# Patient Record
Sex: Female | Born: 1995 | Race: White | Hispanic: No | Marital: Married
Health system: Midwestern US, Academic
[De-identification: ages and names within clinical notes are randomized; demographics above are authoritative.]

---

## 2018-08-19 ENCOUNTER — Encounter: Admit: 2018-08-19 | Discharge: 2018-08-19

## 2018-08-19 DIAGNOSIS — Z3491 Encounter for supervision of normal pregnancy, unspecified, first trimester: Secondary | ICD-10-CM

## 2018-08-19 DIAGNOSIS — O09299 Supervision of pregnancy with other poor reproductive or obstetric history, unspecified trimester: Secondary | ICD-10-CM

## 2018-08-25 ENCOUNTER — Encounter: Admit: 2018-08-25 | Discharge: 2018-08-25

## 2018-08-25 NOTE — Telephone Encounter
Called patient in regards to missed appointment on 08/23/18. Calling to get her rescheduled. Instructed her to call [phone] to get appt scheduled at soonest convenience.

## 2019-03-16 ENCOUNTER — Encounter: Admit: 2019-03-16 | Discharge: 2019-03-16 | Payer: MEDICAID

## 2019-03-20 ENCOUNTER — Encounter: Admit: 2019-03-20 | Discharge: 2019-03-20 | Payer: MEDICAID

## 2019-03-20 NOTE — Telephone Encounter
Patient returned my call. Confirmed that she wants to consult for a VBAC and provided appointment date/time and location. Provided address and directions to the clinic. Advised to arrive at least 15 minutes early for check in. Patient verbalized understanding. Informed that we will need the Op report from her previous c/s prior to visit. Confirmed that patient delivered at [facility] and informed that RN faxed a request for her records. Advised patient to f/u with Dr. Thomasene Lot office to see if they have a copy of this report as well. Advised to have Dr. Thomasene Lot office fax a copy and/or contact [facility] to request to have her c/s report faxed ASAP. Provided the fax number for Medical Records. Patient verbalized understanding. Provided the phone number for Team 1 nurses so that patient can f/u on this request. Patient will be 36wk4d at time of consult. Confirmed that [employer] insurance is active.

## 2019-03-20 NOTE — Telephone Encounter
Received an email from Team 1 nurses, stating that this patient has a referral for consult VBAC and will need to be scheduled. Patient is currently 36wks and is transferring care from Dr. Thomasene Lot office at [facility] in [place]. Team 1 provided a phone number of [phone] for [facility], Dr. Thomasene Lot nurse, as a means for contact.   Called [facility] and and left a detailed message for [place]. Advised that RN will need for her to call me back as soon as possible. Also called and left a message for patient. RN scheduled patient for this week to f/u with Dr. Pecola Leisure. Will provide appointment information/instructions at time of call back. Records have already been scanned into the chart. RN will send a request for c/s (Op) report at this time. Staff message sent to the PSRs to [employer].

## 2019-03-20 NOTE — Telephone Encounter
Received a message from Lyla Son, nurse at Dr. Alvis Lemmings office, stating that she is returning my call regarding a STAT referral. Requested a call back and provided a call back number of [phone].  Called and attempted to contact Carrie x 2, however the phone rings for several times and then goes to a busy signal. Will inform Lyla Son that this patient is already scheduled and has been contacted, at time of call back.

## 2019-03-22 ENCOUNTER — Encounter: Admit: 2019-03-22 | Discharge: 2019-03-22 | Payer: MEDICAID

## 2019-03-22 NOTE — Telephone Encounter
Records received from [facility] given to [place] to scan to chart ASAP

## 2019-03-23 ENCOUNTER — Ambulatory Visit: Admit: 2019-03-23 | Discharge: 2019-03-24 | Payer: MEDICAID

## 2019-03-23 ENCOUNTER — Encounter: Admit: 2019-03-23 | Discharge: 2019-03-23 | Payer: MEDICAID

## 2019-03-23 DIAGNOSIS — F4310 Post-traumatic stress disorder, unspecified: Secondary | ICD-10-CM

## 2019-03-23 DIAGNOSIS — J45909 Unspecified asthma, uncomplicated: Secondary | ICD-10-CM

## 2019-03-23 DIAGNOSIS — F419 Anxiety disorder, unspecified: Secondary | ICD-10-CM

## 2019-03-23 DIAGNOSIS — Z3400 Encounter for supervision of normal first pregnancy, unspecified trimester: Secondary | ICD-10-CM

## 2019-03-23 DIAGNOSIS — N390 Urinary tract infection, site not specified: Secondary | ICD-10-CM

## 2019-03-23 DIAGNOSIS — F329 Major depressive disorder, single episode, unspecified: Secondary | ICD-10-CM

## 2019-03-23 DIAGNOSIS — Z8742 Personal history of other diseases of the female genital tract: Secondary | ICD-10-CM

## 2019-03-23 MED ORDER — ALBUTEROL SULFATE 90 MCG/ACTUATION IN HFAA
2 | RESPIRATORY_TRACT | 0 refills | Status: AC | PRN
Start: 2019-03-23 — End: ?

## 2019-03-23 MED ORDER — CEPHALEXIN 250 MG PO CAP
250 mg | ORAL_CAPSULE | Freq: Every day | ORAL | 0 refills | Status: AC
Start: 2019-03-23 — End: ?

## 2019-03-23 NOTE — Progress Notes
S: 24 y.o. [id number] @ [redacted]w[redacted]d by 1st trimester outside ultrasound per patient report. (VAVDx1 H/o C/S x1) Estimated Date of Delivery: 04/17/2019. This is an unplanned but desired pregnancy. She feels excited FOB is involved.   +FM, +CTX occ VB/LOF. No concerns.     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 She has lost these inhalers when she moved and has not needed them.   Anxiety/depression/PTSD follows with counselor and case management worker previously on zoloft, stopped during pregnancy, PPD after last delivery    H/o recurret UTIs: multiple UTIs this pregnancy, not taking any suppressive medication at this time   Tobacco use: vapes occasionally, no long with tobacco use, pods last for about 3 days   H/o C/S x1, desires TOLAC, op report reviewed PLTCS performed for IUGR.   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, she has been on 1000mg  of Depakote but discontinued when she moved and stopped following with a neurologist at 2018, when asked if she has had a seizure since that time states probably she is unable to tell if she is having one so this is a difficult question to answer      OBHx:  OB History   Gravida Para Term Preterm AB Living   7 1 1   4 2    SAB TAB Ectopic Multiple Live Births   4              # Outcome Date GA Lbr Len/2nd Weight Sex Delivery Anes PTL Lv   7 Gravida            6 Gravida            5 SAB            4 SAB            3 SAB            2 SAB            1 Term            1st delivery: FT, VAVD with 3rd degree tear, 3400g  2nd delivery: 37 wga, PLTCS for IUGR, 2500g,     GynHx:  LMP: unknown  Pap: H/o abnormal pap HSIL HPV s/p colpo, ECC, CKC discussion of hysterectomy after this result, completed in [place] maybe [place], will request records.   - was placed on a form of chemotherapy a pill that was supposed to slow the growth of the cells - She had a uterine infection that would not go away after the delivery of both of her children and a hysterectomy was discussed    STI: h/o CT in 2019 s/p treatment     MedHx:  Medical History:   Diagnosis Date   ? Anxiety    ? Asthma    ? Depression    ? PTSD (post-traumatic stress disorder)    ? UTI (urinary tract infection)      Medications:  Objective:         ? cyclobenzaprine HCl (FLEXERIL PO) Take 10 mg by mouth. Once Daily   ? HYDROCODONE-ACETAMINOPHEN PO Take 325 mg by mouth. BID (PRN)   ? prenatal vits62/FA/om3/dha/epa (PRENATAL GUMMY PO) Take  by mouth.     Allergies    Allergies   Allergen Reactions   ? Ibuprofen ANAPHYLAXIS   ? Ketorolac Tromethamine  ANAPHYLAXIS   ? Nsaids (Non-Steroidal Anti-Inflammatory Drug) ANAPHYLAXIS and RASH   ? Amoxicillin HIVES   ? Nitrofurantoin Monohyd/M-Cryst RASH   ? Ranitidine HIVES     Shx:  Surgical History:   Procedure Laterality Date   ? CESAREAN SECTION       FamHx:  Family History   Problem Relation Age of Onset   ? Heart Disease Mother    ? Diabetes Mother    ? Hypertension Mother    ? Diabetes Maternal Grandmother    ? Pulmonary Embolism Maternal Grandfather      SocHx:  Lives with: partner feels safe at home  Denies: EtOH, tobacco, THC use but does vape     O:   Vitals:    03/23/19 1003   BP: 112/81   Pulse: 115   Temp: 36.8 ?C (98.2 ?F)     FH: 35cm  FHT: 145  FH 35cm  Gen: NAD  Abd: gravid nttp  Ext: no LE edema  SVD: cl/50/-3     A/P:  Emily [name] is a 24 y.o. 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, she has been on 1000mg  of Depakote but discontinued when she moved and stopped following with a neurologist at 2018, when asked if she has had a seizure since that time states probably she is unable to tell if she is having one so this is a difficult question to answer    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  Pt understands that there is a risk of uterine rupture, 0.5-1%.  Pt also understands there is a risk of resulting fetal neurologic injury of approximately 10% if that were to occur, or even death.  Also discussed risk of failure and need for repeat c/s.  Pt voiced understanding and elected to proceed with TOLAC.  - requested outside sono records as we cannot schedule patient's desired IOL at 53 wga until we have accurate dating     Routine PNL  BCM Plan: undecided, maybe IUD   Undecided on feeding, leaning towards bottle   Continue Routine Prenatal Care  NT wnl  DUS: per outside records, 38%, posterior placenta, possible small VSD on DUS however fetal echo wnl  S/p TDaP vaccination   GTT normal per patient  GBS collected at [facility]   Labor Precautions and [facility]KC instructions given  Flu Vaccine declines     Next appointment, determine once IOL scheduled     D/w Dr. Aubery Lapping, MD  PGY-2 Obstetrics and Gynecology

## 2019-03-23 NOTE — Patient Instructions
General Instructions  For prompt and efficient communication, MyChart is preferred. Please sign up.  [email or web address]      To Contact Me:   Please send a MyChart message to your doctor or call [phone] to reach your doctor's nurse team. Please only call once in a 24-hour period. Leave a voicemail for the nurse to respond.  Calls or messages received after 4pm will be returned the next business day.  To Have Medication Refilled:   Please use the MyChart Refill request or contact your pharmacy directly to request medication refills. Please allow 72 hours.  To Receive Your Test Results:  ? Please allow 2 business day for labs to result in MyChart.  ? Please allow 4 business days for other tests, including cardiac studies, blood bank, and radiology results.  ? It can take up to 10 days for pathology  Results NOT released to MyChart:  ? OB Ultrasound  ? Genetic testing  ? Labs that are sent to outside facilities  Our Lab (Location, Hours, and Fasting Information)  Lab Location: the 1st floor of the Medical Office Building, directly to the left of the Information Desk.  Lab Hours: Monday 6:30 am-6 pm, Tuesday-Friday 7 am-6 pm, and Saturday 7 am-noon.   Fasting for Lab: For fasting labs, please:   Do not eat for at least 8-10 hours before having your labs drawn, drink plenty of water, and make sure to continue your medications as prescribed (unless otherwise specified).  Radiology is on the 2nd floor of the Medical Office Building. Please call [phone], option #1; Mammogram at [facility] location, please call [phone], option #1.  To Schedule an Appointment:   Contact our schedulers at [phone] and select #1 to make an appointment. At this time, you will be encouraged to signed up to MyChart if you not active.     To Receive Appointment Reminders on Your Cell Phone:   Make sure we have your cell phone number, and text [place] to [phone].  For Urgent Questions: For medical emergencies, call 911.  On nights, weekends or holidays, call the Hospital operator at [phone], and ask for the ?Ob/Gyn Physician on call or Certified Nurse Midwife on call.?        We value your feedback and you may receive a survey about your experience with our office. If you had a favorable experience, the only positive survey results that we will receive are if we are rated in the 9 or 10 range out of 10 points. Please let us know why we did not earn 9-10 rating.  Thank you.

## 2019-03-24 ENCOUNTER — Encounter: Admit: 2019-03-24 | Discharge: 2019-03-24 | Payer: MEDICAID

## 2019-03-26 ENCOUNTER — Encounter: Admit: 2019-03-26 | Discharge: 2019-03-26 | Payer: MEDICAID

## 2019-03-26 NOTE — Progress Notes
Patient was seen for consult for TOLAC. Desires IOL.    Unfortunately we do not have her ultrasound report from [facility]. This has been requested by nursing staff. Once we receive that ultrasound report we can confirm dating and schedule IOL at 37 wga per patient request.    Clemetine Marker, MD  PGY 2 Obstetrics and Gynecology  Pager: [phone]

## 2019-03-28 ENCOUNTER — Encounter: Admit: 2019-03-28 | Discharge: 2019-03-28 | Payer: MEDICAID

## 2019-03-28 NOTE — Telephone Encounter
Rocky Crafts, LPN  Chrisandra Netters, MD; Miguel Aschoff, LPN; Rocky Crafts, LPN            I spoke with a nurse and she faxed over the ultrasounds Angie will scan them in stat    Previous Messages    ----- Message -----   From: Chrisandra Netters, MD   Sent: 03/26/2019 10:54 AM CST   To: Miguel Aschoff, LPN, Rocky Crafts, LPN     Hello!     Just following up on this patient. Has her ultrasound report from [facility] been requested?   Once we get that we can schedule her IOL.     Thank you!     [name]

## 2019-03-30 ENCOUNTER — Encounter: Admit: 2019-03-30 | Discharge: 2019-03-30 | Payer: MEDICAID

## 2019-03-30 NOTE — Telephone Encounter
Emily Crafts, LPN  Chrisandra Netters, MD; Emily Aschoff, LPN; Emily Crafts, LPN            Spoke with patient notified her induction date directions given    Previous Messages    ----- Message -----   From: Chrisandra Netters, MD   Sent: 03/30/2019  8:50 AM CST   To: Emily Aschoff, LPN, Emily Crafts, LPN     Please call and inform patient of scheduled IOL at 2000 on 04/09/20. Thank you!   ----- Message -----   From: Emily Crafts, LPN   Sent: 0/08/1217  9:29 AM CST   To: Emily Aschoff, LPN, Chrisandra Netters, MD, *     I spoke with a nurse and she faxed over the ultrasounds Angie will scan them in stat   ----- Message -----   From: Chrisandra Netters, MD   Sent: 03/26/2019 10:54 AM CST   To: Emily Aschoff, LPN, Emily Crafts, LPN     Hello!     Just following up on this patient. Has her ultrasound report from [facility] been requested?   Once we get that we can schedule her IOL.     Thank you!     [name]

## 2019-03-30 NOTE — Progress Notes
OSH dating ultrasound received.     Patient with 8wk sono confirming due date of 04/17/19, placing her at [redacted]w[redacted]d today.     IOL for TOLAC scheduled for 39 wga per patient request, 04/10/19 at 2000.     Clemetine Marker, MD  PGY 2 Obstetrics and Gynecology  Pager: [phone]

## 2019-04-02 ENCOUNTER — Encounter: Admit: 2019-04-02 | Discharge: 2019-04-02 | Payer: MEDICAID

## 2019-04-02 DIAGNOSIS — N390 Urinary tract infection, site not specified: Secondary | ICD-10-CM

## 2019-04-02 DIAGNOSIS — F4310 Post-traumatic stress disorder, unspecified: Secondary | ICD-10-CM

## 2019-04-02 DIAGNOSIS — F419 Anxiety disorder, unspecified: Secondary | ICD-10-CM

## 2019-04-02 DIAGNOSIS — F329 Major depressive disorder, single episode, unspecified: Secondary | ICD-10-CM

## 2019-04-02 DIAGNOSIS — J45909 Unspecified asthma, uncomplicated: Secondary | ICD-10-CM

## 2019-04-03 ENCOUNTER — Encounter: Admit: 2019-04-03 | Discharge: 2019-04-03 | Payer: MEDICAID

## 2019-04-03 ENCOUNTER — Encounter: Admit: 2019-04-03 | Discharge: 2019-04-02 | Payer: MEDICAID

## 2019-04-03 DIAGNOSIS — N390 Urinary tract infection, site not specified: Secondary | ICD-10-CM

## 2019-04-03 DIAGNOSIS — F4310 Post-traumatic stress disorder, unspecified: Secondary | ICD-10-CM

## 2019-04-03 DIAGNOSIS — F329 Major depressive disorder, single episode, unspecified: Secondary | ICD-10-CM

## 2019-04-03 DIAGNOSIS — J45909 Unspecified asthma, uncomplicated: Secondary | ICD-10-CM

## 2019-04-03 DIAGNOSIS — F419 Anxiety disorder, unspecified: Secondary | ICD-10-CM

## 2019-04-03 DIAGNOSIS — R569 Unspecified convulsions: Secondary | ICD-10-CM

## 2019-04-03 MED ORDER — OXYCODONE 5 MG PO TAB
5 mg | Freq: Once | ORAL | 0 refills | Status: CP
Start: 2019-04-03 — End: ?
  Administered 2019-04-03: 09:00:00 5 mg via ORAL

## 2019-04-03 MED ORDER — ACETAMINOPHEN 500 MG PO TAB
1000 mg | ORAL | 0 refills | Status: DC | PRN
Start: 2019-04-03 — End: 2019-04-03
  Administered 2019-04-03: 07:00:00 1000 mg via ORAL

## 2019-04-08 ENCOUNTER — Encounter: Admit: 2019-04-08 | Discharge: 2019-04-08 | Payer: MEDICAID

## 2019-04-08 DIAGNOSIS — F4310 Post-traumatic stress disorder, unspecified: Secondary | ICD-10-CM

## 2019-04-08 DIAGNOSIS — R569 Unspecified convulsions: Secondary | ICD-10-CM

## 2019-04-08 DIAGNOSIS — F419 Anxiety disorder, unspecified: Secondary | ICD-10-CM

## 2019-04-08 DIAGNOSIS — N390 Urinary tract infection, site not specified: Secondary | ICD-10-CM

## 2019-04-08 DIAGNOSIS — J45909 Unspecified asthma, uncomplicated: Secondary | ICD-10-CM

## 2019-04-08 DIAGNOSIS — F329 Major depressive disorder, single episode, unspecified: Secondary | ICD-10-CM

## 2019-04-08 MED ORDER — ACETAMINOPHEN 500 MG PO TAB
1000 mg | Freq: Once | ORAL | 0 refills | Status: CP
Start: 2019-04-08 — End: ?
  Administered 2019-04-09: 02:00:00 1000 mg via ORAL

## 2019-04-08 MED ORDER — LACTATED RINGERS IV SOLP
1000 mL | INTRAVENOUS | 0 refills | Status: CP
Start: 2019-04-08 — End: ?

## 2019-04-10 ENCOUNTER — Inpatient Hospital Stay: Admit: 2019-04-10 | Discharge: 2019-04-10 | Payer: MEDICAID

## 2019-04-10 ENCOUNTER — Encounter: Admit: 2019-04-10 | Discharge: 2019-04-10 | Payer: MEDICAID

## 2019-04-10 DIAGNOSIS — N390 Urinary tract infection, site not specified: Secondary | ICD-10-CM

## 2019-04-10 DIAGNOSIS — F419 Anxiety disorder, unspecified: Secondary | ICD-10-CM

## 2019-04-10 DIAGNOSIS — J45909 Unspecified asthma, uncomplicated: Secondary | ICD-10-CM

## 2019-04-10 DIAGNOSIS — R569 Unspecified convulsions: Secondary | ICD-10-CM

## 2019-04-10 DIAGNOSIS — F4310 Post-traumatic stress disorder, unspecified: Secondary | ICD-10-CM

## 2019-04-10 DIAGNOSIS — F329 Major depressive disorder, single episode, unspecified: Secondary | ICD-10-CM

## 2019-04-10 MED ORDER — MISOPROSTOL 200 MCG PO TAB
800 ug | Freq: Once | RECTAL | 0 refills | Status: DC
Start: 2019-04-10 — End: 2019-04-11
  Administered 2019-04-11: 18:00:00 800 ug via RECTAL

## 2019-04-10 MED ORDER — OXYTOCIN IN 0.9 % SOD CHLORIDE 30 UNIT/500 ML IV SOLN
.5-20 m[IU]/min | INTRAVENOUS | 0 refills | Status: DC
Start: 2019-04-10 — End: 2019-04-11
  Administered 2019-04-11: 06:00:00 2 m[IU]/min via INTRAVENOUS

## 2019-04-10 MED ORDER — LIDOCAINE (PF) 10 MG/ML (1 %) IJ SOLN
INTRAMUSCULAR | 0 refills | Status: CP
Start: 2019-04-10 — End: ?
  Administered 2019-04-11: 05:00:00 5 mL via INTRAMUSCULAR

## 2019-04-10 MED ORDER — SODIUM CITRATE-CITRIC ACID 500-334 MG/5 ML PO SOLN
30 mL | Freq: Once | ORAL | 0 refills | Status: AC | PRN
Start: 2019-04-10 — End: ?

## 2019-04-10 MED ORDER — LACTATED RINGERS IV SOLP
INTRAVENOUS | 0 refills | Status: DC
Start: 2019-04-10 — End: 2019-04-11
  Administered 2019-04-11: 07:00:00 250 mL via INTRAVENOUS
  Administered 2019-04-11: 16:00:00 1000.000 mL via INTRAVENOUS

## 2019-04-10 MED ORDER — LIDOCAINE-EPINEPHRINE (PF) 1.5 %-1:200,000 IJ SOLN (OR)
0 refills | Status: CP
Start: 2019-04-10 — End: ?

## 2019-04-10 MED ORDER — NALOXONE 0.4 MG/ML IJ SOLN
.08 mg | INTRAVENOUS | 0 refills | Status: DC | PRN
Start: 2019-04-10 — End: 2019-04-11

## 2019-04-10 MED ORDER — ALBUTEROL SULFATE 90 MCG/ACTUATION IN HFAA
2 | RESPIRATORY_TRACT | 0 refills | Status: DC | PRN
Start: 2019-04-10 — End: 2019-04-12

## 2019-04-10 MED ORDER — OXYTOCIN IN 0.9 % SOD CHLORIDE 30 UNIT/500 ML IV SOLN
24 [IU] | Freq: Once | INTRAVENOUS | 0 refills | Status: DC
Start: 2019-04-10 — End: 2019-04-11

## 2019-04-10 MED ORDER — FENTANYL/BUPIVACAINE 2 MCG/ML-0.1% PCA EPIDURAL SYRINGE
EPIDURAL | 0 refills | Status: DC
Start: 2019-04-10 — End: 2019-04-11
  Administered 2019-04-11 (×5): 50.000 mL via EPIDURAL

## 2019-04-11 ENCOUNTER — Encounter: Admit: 2019-04-11 | Discharge: 2019-04-11 | Payer: MEDICAID

## 2019-04-11 DIAGNOSIS — F4310 Post-traumatic stress disorder, unspecified: Secondary | ICD-10-CM

## 2019-04-11 DIAGNOSIS — N390 Urinary tract infection, site not specified: Secondary | ICD-10-CM

## 2019-04-11 DIAGNOSIS — F419 Anxiety disorder, unspecified: Secondary | ICD-10-CM

## 2019-04-11 DIAGNOSIS — R569 Unspecified convulsions: Secondary | ICD-10-CM

## 2019-04-11 DIAGNOSIS — F329 Major depressive disorder, single episode, unspecified: Secondary | ICD-10-CM

## 2019-04-11 DIAGNOSIS — J45909 Unspecified asthma, uncomplicated: Secondary | ICD-10-CM

## 2019-04-11 MED ORDER — LACTATED RINGERS IV SOLP
500 mL | INTRAVENOUS | 0 refills | Status: CP
Start: 2019-04-11 — End: ?
  Administered 2019-04-11: 17:00:00 1000 mL via INTRAVENOUS

## 2019-04-11 MED ORDER — OXYTOCIN IN 0.9 % SOD CHLORIDE 30 UNIT/500 ML IV SOLN
30 [IU] | Freq: Once | INTRAVENOUS | 0 refills | Status: AC | PRN
Start: 2019-04-11 — End: ?

## 2019-04-11 MED ORDER — ACETAMINOPHEN 325 MG PO TAB
650 mg | ORAL | 0 refills | Status: DC | PRN
Start: 2019-04-11 — End: 2019-04-12
  Administered 2019-04-11 (×2): 650 mg via ORAL

## 2019-04-11 MED ORDER — SIMETHICONE 80 MG PO CHEW
80 mg | ORAL | 0 refills | Status: DC | PRN
Start: 2019-04-11 — End: 2019-04-12

## 2019-04-11 MED ORDER — HYDROCODONE-ACETAMINOPHEN 5-325 MG PO TAB
1 | ORAL | 0 refills | Status: DC | PRN
Start: 2019-04-11 — End: 2019-04-12
  Administered 2019-04-12: 02:00:00 1 via ORAL

## 2019-04-11 MED ORDER — DOCUSATE SODIUM 100 MG PO CAP
100 mg | Freq: Two times a day (BID) | ORAL | 0 refills | Status: DC
Start: 2019-04-11 — End: 2019-04-12
  Administered 2019-04-12: 14:00:00 100 mg via ORAL

## 2019-04-11 MED ORDER — MAGNESIUM HYDROXIDE 2,400 MG/10 ML PO SUSP
10 mL | ORAL | 0 refills | Status: DC | PRN
Start: 2019-04-11 — End: 2019-04-12

## 2019-04-11 MED ORDER — ONDANSETRON HCL (PF) 4 MG/2 ML IJ SOLN
4 mg | INTRAVENOUS | 0 refills | Status: DC | PRN
Start: 2019-04-11 — End: 2019-04-12
  Administered 2019-04-11: 08:00:00 4 mg via INTRAVENOUS

## 2019-04-11 MED ORDER — LANOLIN TP CREA
TOPICAL | 0 refills | Status: DC | PRN
Start: 2019-04-11 — End: 2019-04-12

## 2019-04-11 MED ADMIN — LACTATED RINGERS IV SOLP [4318]: INTRAVENOUS | @ 04:00:00 | Stop: 2019-04-11 | NDC 00338011704

## 2019-04-11 NOTE — Anesthesia Pre-Procedure Evaluation
Anesthesia Pre-Procedure Evaluation    Name: Emily [name]      MRN: [id number]     DOB: [birth year]     Age: 24 y.o.     Sex: female   _________________________________________________________________________     Procedure Info:   Procedure Information    Date: 04/10/19  Procedure: ANESTHESIA PRE-EVAL         Physical Assessment  Vital Signs (last filed in past 24 hours):  Height: 149.9 cm (59) (02/15 2046)  Weight: 66.2 kg (146 lb) (02/15 2046)      Patient History   Allergies   Allergen Reactions   ? Ibuprofen ANAPHYLAXIS   ? Ketorolac Tromethamine ANAPHYLAXIS   ? Nsaids (Non-Steroidal Anti-Inflammatory Drug) ANAPHYLAXIS and RASH   ? Amoxicillin HIVES   ? Cherry HIVES and SHORTNESS OF BREATH   ? Nitrofurantoin Monohyd/M-Cryst RASH   ? Ranitidine HIVES   ? Seasonal Allergies EYE IRRITATION        Current Medications    Medication Directions   albuterol sulfate (PROAIR HFA) 90 mcg/actuation HFA aerosol inhaler Inhale two puffs by mouth into the lungs every 6 hours as needed for Wheezing or Shortness of Breath. Shake well before use.   cephalexin (KEFLEX) 250 mg capsule Take one capsule by mouth daily for 60 days.   cyclobenzaprine HCl (FLEXERIL PO) Take 10 mg by mouth. Once Daily   HYDROCODONE-ACETAMINOPHEN PO Take 325 mg by mouth. BID (PRN)   prenatal vits62/FA/om3/dha/epa (PRENATAL GUMMY PO) Take  by mouth.         Review of Systems/Medical History      Patient summary reviewed  Nursing notes reviewed  Pertinent labs reviewed    PONV Screening: Female gender and Non-smoker      Airway - negative        Pulmonary           Asthma      Cardiovascular - negative        GI/Hepatic/Renal           GERD, well controlled      Neuro/Psych       Seizures        Psychiatric history          Depression          Anxiety      PTSD      Musculoskeletal - negative        Endocrine/Other - negative      Constitution - negative   Physical Exam    Airway Findings      Mallampati: II      TM distance: >3 FB      Neck ROM: full Mouth opening: good      Airway patency: adequate    Dental Findings:       Upper dentures and lower dentures    Cardiovascular Findings: Negative      Pulmonary Findings: Negative      Abdominal Findings:         Gravid    Neurological Findings: Negative         Diagnostic Tests  Hematology:   Lab Results   Component Value Date    HGB 10.6 04/10/2019    HCT 31.2 04/10/2019    PLTCT 211 04/10/2019    WBC 11.2 04/10/2019    NEUT 69 04/10/2019    ANC 7.72 04/10/2019    ALC 2.56 04/10/2019    MONA 6 04/10/2019    AMC 0.68 04/10/2019    EOSA 1  04/10/2019    ABC 0.09 04/10/2019    MCV 83.2 04/10/2019    MCH 28.4 04/10/2019    MCHC 34.1 04/10/2019    MPV 10.1 04/10/2019    RDW 13.4 04/10/2019         General Chemistry: No results found for: NA, K, CL, CO2, GAP, BUN, CR, GLU, CA, KETONES, ALBUMIN, LACTIC, OBSCA, MG, TOTBILI, TOTBILCB, PO4   Coagulation: No results found for: PT, PTT, INR      Anesthesia Plan    ASA score: 3   Plan: epidural  NPO status: full stomach      Informed Consent  Anesthetic plan and risks discussed with patient and spouse.  Use of blood products discussed with patient and spouse      Plan discussed with: anesthesiologist and CRNA.

## 2019-04-11 NOTE — Anesthesia Pre-Procedure Evaluation
Anesthesia Pre-Procedure Evaluation    Name: Emily [name]      MRN: [id number]     DOB: 1995/09/21     Age: 24 y.o.     Sex: female   _________________________________________________________________________     Procedure Info:   Procedure Information    Date: 04/10/19  Procedure: ANESTHESIA LABOR EPIDURAL         Physical Assessment  Vital Signs (last filed in past 24 hours):  Temp: 36.7 ?C (98.1 ?F) (02/15 2030)  Respirations: 18 PER MINUTE (02/15 2030)  Height: 149.9 cm (59) (02/15 2046)  Weight: 66.2 kg (146 lb) (02/15 2046)      Patient History   Allergies   Allergen Reactions   ? Ibuprofen ANAPHYLAXIS   ? Ketorolac Tromethamine ANAPHYLAXIS   ? Nsaids (Non-Steroidal Anti-Inflammatory Drug) ANAPHYLAXIS and RASH   ? Amoxicillin HIVES   ? Cherry HIVES and SHORTNESS OF BREATH   ? Nitrofurantoin Monohyd/M-Cryst RASH   ? Ranitidine HIVES   ? Seasonal Allergies EYE IRRITATION        Current Medications    Medication Directions   albuterol sulfate (PROAIR HFA) 90 mcg/actuation HFA aerosol inhaler Inhale two puffs by mouth into the lungs every 6 hours as needed for Wheezing or Shortness of Breath. Shake well before use.   cephalexin (KEFLEX) 250 mg capsule Take one capsule by mouth daily for 60 days.   cyclobenzaprine HCl (FLEXERIL PO) Take 10 mg by mouth. Once Daily   HYDROCODONE-ACETAMINOPHEN PO Take 325 mg by mouth. BID (PRN)   prenatal vits62/FA/om3/dha/epa (PRENATAL GUMMY PO) Take  by mouth.         Review of Systems/Medical History      Patient summary reviewed  Nursing notes reviewed  Pertinent labs reviewed    PONV Screening: Female gender and Non-smoker      Airway - negative        Pulmonary           Asthma      Cardiovascular - negative        GI/Hepatic/Renal           GERD, well controlled      Neuro/Psych       Seizures        Psychiatric history          Depression          Anxiety      PTSD      Musculoskeletal - negative        Endocrine/Other - negative      Constitution - negative   Physical Exam Airway Findings      Mallampati: II      TM distance: >3 FB      Neck ROM: full      Mouth opening: good      Airway patency: adequate    Dental Findings:       Upper dentures and lower dentures    Cardiovascular Findings: Negative      Pulmonary Findings: Negative      Abdominal Findings:         Gravid    Neurological Findings: Negative         Diagnostic Tests  Hematology:   Lab Results   Component Value Date    HGB 10.6 04/10/2019    HCT 31.2 04/10/2019    PLTCT 211 04/10/2019    WBC 11.2 04/10/2019    NEUT 69 04/10/2019    ANC 7.72 04/10/2019    ALC 2.56  04/10/2019    MONA 6 04/10/2019    AMC 0.68 04/10/2019    EOSA 1 04/10/2019    ABC 0.09 04/10/2019    MCV 83.2 04/10/2019    MCH 28.4 04/10/2019    MCHC 34.1 04/10/2019    MPV 10.1 04/10/2019    RDW 13.4 04/10/2019         General Chemistry: No results found for: NA, K, CL, CO2, GAP, BUN, CR, GLU, CA, KETONES, ALBUMIN, LACTIC, OBSCA, MG, TOTBILI, TOTBILCB, PO4   Coagulation: No results found for: PT, PTT, INR      Anesthesia Plan    ASA score: 3   Plan: epidural  NPO status: full stomach      Informed Consent  Anesthetic plan and risks discussed with patient and spouse.  Use of blood products discussed with patient and spouse      Plan discussed with: anesthesiologist and CRNA.

## 2019-04-12 MED ORDER — ASCORBIC ACID (VITAMIN C) 500 MG PO TAB
500 mg | ORAL_TABLET | Freq: Every day | ORAL | 1 refills | 50.00000 days | Status: AC
Start: 2019-04-12 — End: ?

## 2019-04-12 MED ORDER — SIMETHICONE 80 MG PO CHEW
80 mg | ORAL_TABLET | ORAL | 3 refills | Status: AC | PRN
Start: 2019-04-12 — End: ?

## 2019-04-12 MED ORDER — LANOLIN TP CREA
TOPICAL | 3 refills | Status: AC | PRN
Start: 2019-04-12 — End: ?

## 2019-04-12 MED ORDER — HYDROCODONE-ACETAMINOPHEN 5-325 MG PO TAB
1 | ORAL | 0 refills | Status: DC | PRN
Start: 2019-04-12 — End: 2019-04-12
  Administered 2019-04-12 (×3): 1 via ORAL

## 2019-04-12 MED ORDER — FERROUS SULFATE 325 MG (65 MG IRON) PO TAB
325 mg | ORAL_TABLET | Freq: Every day | ORAL | 1 refills | Status: AC
Start: 2019-04-12 — End: ?

## 2019-04-25 ENCOUNTER — Encounter: Admit: 2019-04-25 | Discharge: 2019-04-25 | Payer: MEDICAID

## 2019-04-25 NOTE — Telephone Encounter
Attempted to contact patient to f/u during the postpartum period, however she is unavailable at this time. LMTRC, awaiting call back.

## 2019-05-03 ENCOUNTER — Encounter: Admit: 2019-05-03 | Discharge: 2019-05-03 | Payer: MEDICAID

## 2019-05-03 DIAGNOSIS — J45909 Unspecified asthma, uncomplicated: Secondary | ICD-10-CM

## 2019-05-03 DIAGNOSIS — F419 Anxiety disorder, unspecified: Secondary | ICD-10-CM

## 2019-05-03 DIAGNOSIS — R569 Unspecified convulsions: Secondary | ICD-10-CM

## 2019-05-03 DIAGNOSIS — F329 Major depressive disorder, single episode, unspecified: Secondary | ICD-10-CM

## 2019-05-03 DIAGNOSIS — N390 Urinary tract infection, site not specified: Secondary | ICD-10-CM

## 2019-05-03 DIAGNOSIS — F4310 Post-traumatic stress disorder, unspecified: Secondary | ICD-10-CM

## 2019-06-02 ENCOUNTER — Encounter: Admit: 2019-06-02 | Discharge: 2019-06-02 | Payer: MEDICAID

## 2019-06-05 ENCOUNTER — Encounter: Admit: 2019-06-05 | Discharge: 2019-06-05 | Payer: MEDICAID

## 2020-01-29 ENCOUNTER — Encounter: Admit: 2020-01-29 | Discharge: 2020-01-29 | Payer: MEDICAID

## 2020-01-29 ENCOUNTER — Ambulatory Visit: Admit: 2020-01-29 | Discharge: 2020-01-29 | Payer: MEDICAID

## 2020-01-29 DIAGNOSIS — O09299 Supervision of pregnancy with other poor reproductive or obstetric history, unspecified trimester: Secondary | ICD-10-CM

## 2020-01-29 DIAGNOSIS — O0991 Supervision of high risk pregnancy, unspecified, first trimester: Secondary | ICD-10-CM

## 2020-01-29 DIAGNOSIS — Z3491 Encounter for supervision of normal pregnancy, unspecified, first trimester: Secondary | ICD-10-CM

## 2020-01-29 DIAGNOSIS — J45909 Unspecified asthma, uncomplicated: Secondary | ICD-10-CM

## 2020-01-29 DIAGNOSIS — R569 Unspecified convulsions: Secondary | ICD-10-CM

## 2020-01-29 DIAGNOSIS — F4310 Post-traumatic stress disorder, unspecified: Secondary | ICD-10-CM

## 2020-01-29 DIAGNOSIS — F32A Depression: Secondary | ICD-10-CM

## 2020-01-29 DIAGNOSIS — F419 Anxiety disorder, unspecified: Secondary | ICD-10-CM

## 2020-01-29 DIAGNOSIS — N390 Urinary tract infection, site not specified: Secondary | ICD-10-CM

## 2020-01-29 DIAGNOSIS — O2341 Unspecified infection of urinary tract in pregnancy, first trimester: Secondary | ICD-10-CM

## 2020-01-29 MED ORDER — CEPHALEXIN 500 MG PO CAP
500 mg | ORAL_CAPSULE | Freq: Four times a day (QID) | ORAL | 0 refills | Status: AC
Start: 2020-01-29 — End: ?

## 2020-01-29 NOTE — Progress Notes
High [facility] - Initial Prenatal Visit    HPI:  Emily [name] is a 24 y.o. F [id number] at [redacted]w[redacted]d by 12 week ultrasound.  She presents today as a referral from Dr. Jason Nest for an initial HR appt for recurrent pregnancy loss x4, history of cesarean delivery, history of FGR requiring CS, history of abnormal pap.    Last two deliveries were term, most recent TOLAC 04/10/2019. She is uncertain what she desires to do this time but thinks TOLAC.     Patient reports she has had workup for APAS in the past which was negative. She is unsure if she has had carrier screening. Reports trying to get NIPT but it was denied by her insurance.     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 Per patient has episodes daily where she is staring off into space. Corroborated by her partner. She apparently has strange eye movements but denies other rhythmic movements during these moments. She does not remember anything about them. She is supposed to see neurology again soon regarding reestablishing medications.     Too early FM, -VB, -CTX, -LOF    Obstetrical History:  OB History   Gravida Para Term Preterm AB Living   8 3 3  0 4 3   SAB TAB Ectopic Multiple Live Births   4 0 0 0 3      # Outcome Date GA Lbr Len/2nd Weight Sex Delivery Anes PTL Lv   8 Current            7 Term 04/11/19 [redacted]w[redacted]d  3.14 kg (6 lb 14.8 oz) M Spontaneous EPI N LIV   6 Term 2019 [redacted]w[redacted]d   F CS-LTranv   LIV      Birth Comments: uterine infection post delivery      Complications: IUGR (intrauterine growth restriction) in prior pregnancy, pregnant, Short cervix   5 SAB 2018              Birth Comments: D&C needed    4 Term 2017 [redacted]w[redacted]d    Vag-Spont   LIV      Birth Comments: 3rd degree tear; uterine infection   3 SAB 2016           2 SAB 2015           1 SAB 2015               Gynecologic History:   H/o abnormal pap HSIL HPV s/p colpo, ECC, [facility] discussion of hysterectomy after this result, completed in [place] maybe [place], will request records.   - was placed on a form of chemotherapy a pill that was supposed to slow the growth of the cells   - repeat pap 02/2019 was LSIL possible HSIL  - per patient recent normal pap smear in [place] at beginning of pregnancy  STDs: remote chlamydia  Menses:   Menstrual History   ? Age at menarche     ? Duration of menses     ? Length of Cycle     ? Menopause?     ? Age at menopause          Past Medical History:  Medical History:   Diagnosis Date   ? Anxiety    ? Asthma    ? Depression    ? PTSD (post-traumatic stress disorder)    ? Seizure (HCC)     on depakote until 2019, absent seizures   ? UTI (urinary tract infection)  Past Surgical History:  Surgical History:   Procedure Laterality Date   ? CHOLECYSTECTOMY  2006   ? CHOLECYSTECTOMY  2006   ? ABDOMEN SURGERY      laparascopy 2019   ? CESAREAN SECTION     ? GALLBLADDER SURGERY     ? HX APPENDECTOMY         Family History:  Family History   Problem Relation Age of Onset   ? Heart Disease Mother    ? Diabetes Mother    ? Hypertension Mother    ? Diabetes Maternal Grandmother    ? Pulmonary Embolism Maternal Grandfather        Social History:  Denies tobacco, etoh, drugs    Allergies:  Allergies   Allergen Reactions   ? Ibuprofen ANAPHYLAXIS   ? Ketorolac Tromethamine ANAPHYLAXIS   ? Nsaids (Non-Steroidal Anti-Inflammatory Drug) ANAPHYLAXIS and RASH   ? Amoxicillin HIVES   ? Cherry HIVES and SHORTNESS OF BREATH   ? Nitrofurantoin Monohyd/M-Cryst RASH   ? Ranitidine HIVES   ? Seasonal Allergies EYE IRRITATION       Medications:  Outpatient Encounter Medications as of 01/29/2020   Medication Sig Dispense Refill   ? acetaminophen (TYLENOL) 500 mg tablet Take 1,000 mg by mouth every 6 hours as needed for Pain. Max of 4,000 mg of acetaminophen in 24 hours.     ? albuterol sulfate (PROAIR HFA) 90 mcg/actuation HFA aerosol inhaler Inhale two puffs by mouth into the lungs every 6 hours as needed for Wheezing or Shortness of Breath. Shake well before use. 25.5 g 0   ? ascorbic acid (VITAMIN C) 500 mg tablet Take one tablet by mouth daily. 30 tablet 1   ? calcium carbonate (TUMS) 500 mg (200 mg elemental calcium) chewable tablet Chew 500-1,000 mg by mouth every 6 hours as needed.     ? cyclobenzaprine (FLEXERIL) 10 mg tablet Take 10 mg by mouth three times daily as needed for Muscle Cramps.     ? diphenhydrAMINE (BENADRYL) 25 mg capsule Take 25 mg by mouth every 6 hours as needed.     ? ferrous sulfate (FEOSOL) 325 mg (65 mg iron) tablet Take one tablet by mouth daily. Take on an empty stomach at least 1 hour before or 2 hours after food. 30 tablet 1   ? HYDROcodone/acetaminophen (NORCO) 5/325 mg tablet Take 1 tablet by mouth twice daily as needed       ? ketoconazole (NIZORAL) 2 % topical shampoo Apply  topically to affected area every 7 days. Apply topically to affected area of damp skin, lather, leave on 5 minutes, and rinse. Repeat     ? lanolin (LAN-O-SOOTHE) topical cream Apply  topically to affected area as Needed. 56 g 3   ? promethazine HCl (PROMETHAZINE PO) Take  by mouth.     ? simethicone (MYLICON) 80 mg chew tablet Chew one tablet by mouth every 6 hours as needed for Flatulence. 30 tablet 3   ? vitamins, prenatal w/iron & folate 65/1 mg tab Take 2 tablets by mouth daily.       No facility-administered encounter medications on file as of 01/29/2020.       Objective:  Vitals:    01/29/20 0833   BP: 99/63   Pulse: 90   Temp: 36.3 ?C (97.4 ?F)     Body mass index is 24.6 kg/m?.    Gen: NAD  CV: regular rate  Lungs: non-labored  Abd: soft, nttp  Ext: no LE  edema, nttp BLE    CAFC [place]:  12/6: [redacted]w[redacted]d posterior placenta NT WNL    Prenatal Labs: reports done in [place] at [facility]    Assessment  24 y.o. F [id number] @ [redacted]w[redacted]d  by 12 week [place]. Estimated Date of Delivery: None noted.  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 Last two pregnancies with term deliveries  ? APAS workup reportedly normal, will request outside records  ? Genetic screening - desires carrier and NIPT today    History of CS x1, VBAC x1  ? Reviewed risks of TOLAC.  Patinet understands that there is a risk of uterine rupture, 0.5-1%.  Patient also understands there is a risk of resulting fetal neurologic injury of approximately 10% if that were to occur, or even death.  Also discussed risk of failure and need for repeat cesarean.  ? Patient planning on TOLAC but would consider either modality depending on if other complications arise    History of abnormal pap smears:  ? Multiple previous high grade abnormal paps  ? Per patient recent pap in [place] was negative  ? Will request records    Eosinophilic asthma  ? Feels symptoms have been stable in pregnancy   ? Current meds: albuterol ~1/week or less  ? Peak flows at each appt  ? Discussed 1/3 of patients stabilize/worsen/improve in pregnancy and importance of alerting OB team if she finds she is requiring increased albuterol     Possible absence seizure history  ? Patient reports history of valproic acid use, not since 2019  ? Follows with neurology at [facility])  ? Reports upcoming appointment to decide on restarting antiepileptic, encouraged to reach out if she does  ? Will request records    Anxiety and depression  Insomnia  ? Current medications: none, previously on Lunesta, benzo to assist with sleep  ? Discussed balance of risks/benefits in treating maternal mood disorders and importance of maternal well being in pregnancy outcomes. Desires to discuss with psychiatry prior to initiating medication  ? Reports previous genetic screening for psych medication response  ? Referral sent to Dr. Bertram Millard  ? SSRI/SNRIs increase risk of Neonatal Poor Adjustment Syndrome:  ? Irritability, feeding difficulties, increased respiratory rate  ? Severe symptoms are rare and occur in 3%  ? Symptoms peak in 24-48h and resolve in a week  ? There is also conflicting evidence in regards to SSRI/SNRI being associated with an increased risk of persistent pulmonary hypertension of the newborn.  There are studies that indicate close to delivery exposure puts infants at an increased risk, where as others found no connection.    ? When weighing the risks and benefits, in regards to continued treatment, it is important to note that having untreated psychiatric disorders is related to pregnancy complications (LBW, PTD).  Each patient has to make an individualized decision based upon their level of comfort with either continuation or discontinuation of medication.  If the patient is taking multiple psychiatric medications, it is recommended to try and use monotherapy with the lowest dose.      History of recurrent UTI  ? Reports last diagnose about a month ago,  was given keflex 500 mg BID  ? Continues to have foul smelling urine  ? UCx sent today, refilled keflex for 500 QID  ? Will follow up symptoms and need for supression    Pregnancy:   ? PNV  ? PNLs, urine culture today  ? G/C swab  ? NT WNL  ? Carrier/NIPT pending  ? Recommend msAFP @15 -22wga  ? Detailed [place] @18 -20wga  ? 2hr GTT, CBC, Ab screen, syph ab @28  wks  ? Recommend flu shot during flu season  ? Recommend Tdap in 3rd trimester  ? Collect GBS @35wga    ? PP BCM: will discuss in 3T  ? Third trimester anesthesia consult needed: N    Primary OB: [facility]    Next ultrasound scheduled for 20 wga  RTC in 4 wk(s)       Seen with Dr. Clarisa Fling    Bing Neighbors, MD  PGY-3 Obstetrics and Gynecology    ATTESTATION    I personally observed the resident performing the E/M, discussed case with resident, and concur with resident documentation of history, physical assessment and treatment plan unless otherwise noted.    Staff name:  Emily Sham, MD Date:  01/29/2020         Future Appointments   Date Time Provider Department Center   02/26/2020 11:00 AM OBGYN [facility]

## 2020-01-31 ENCOUNTER — Encounter: Admit: 2020-01-31 | Discharge: 2020-01-31 | Payer: MEDICAID

## 2020-02-01 ENCOUNTER — Encounter: Admit: 2020-02-01 | Discharge: 2020-02-01 | Payer: MEDICAID

## 2020-02-01 ENCOUNTER — Emergency Department: Admit: 2020-02-01 | Discharge: 2020-02-02 | Payer: MEDICAID

## 2020-02-01 ENCOUNTER — Emergency Department: Admit: 2020-02-01 | Discharge: 2020-02-01 | Payer: MEDICAID

## 2020-02-01 DIAGNOSIS — E871 Hypo-osmolality and hyponatremia: Secondary | ICD-10-CM

## 2020-02-01 DIAGNOSIS — N390 Urinary tract infection, site not specified: Secondary | ICD-10-CM

## 2020-02-01 DIAGNOSIS — N12 Tubulo-interstitial nephritis, not specified as acute or chronic: Secondary | ICD-10-CM

## 2020-02-01 DIAGNOSIS — F32A Depression: Secondary | ICD-10-CM

## 2020-02-01 DIAGNOSIS — F4310 Post-traumatic stress disorder, unspecified: Secondary | ICD-10-CM

## 2020-02-01 DIAGNOSIS — R569 Unspecified convulsions: Secondary | ICD-10-CM

## 2020-02-01 DIAGNOSIS — N200 Calculus of kidney: Secondary | ICD-10-CM

## 2020-02-01 DIAGNOSIS — F419 Anxiety disorder, unspecified: Secondary | ICD-10-CM

## 2020-02-01 DIAGNOSIS — Z3A13 13 weeks gestation of pregnancy: Secondary | ICD-10-CM

## 2020-02-01 DIAGNOSIS — J45909 Unspecified asthma, uncomplicated: Secondary | ICD-10-CM

## 2020-02-01 DIAGNOSIS — E860 Dehydration: Secondary | ICD-10-CM

## 2020-02-01 DIAGNOSIS — O219 Vomiting of pregnancy, unspecified: Secondary | ICD-10-CM

## 2020-02-01 LAB — DIRECT EXAM (WET PREP)

## 2020-02-01 LAB — POC LACTATE: Lab: 1.2 MMOL/L (ref 0.5–2.0)

## 2020-02-01 LAB — CBC AND DIFF: Lab: 18 K/UL — ABNORMAL HIGH (ref 4.5–11.0)

## 2020-02-01 LAB — URINALYSIS MICROSCOPIC REFLEX TO CULTURE

## 2020-02-01 LAB — URINALYSIS DIPSTICK REFLEX TO CULTURE
Lab: NEGATIVE U/L — ABNORMAL LOW (ref 25–110)
Lab: NEGATIVE mL/min (ref 1.0–4.8)
Lab: POSITIVE U/L — AB (ref 7–56)

## 2020-02-01 LAB — POC CREATININE, RAD: Lab: 0.5 mg/dL (ref 0.4–1.00)

## 2020-02-01 LAB — BETA-HCG: Lab: 541 U/L — ABNORMAL HIGH (ref <5)

## 2020-02-01 LAB — COMPREHENSIVE METABOLIC PANEL
Lab: 100 MMOL/L (ref 98–110)
Lab: 135 MMOL/L — ABNORMAL LOW (ref 137–147)
Lab: >60 mL/min (ref >60)

## 2020-02-01 LAB — MAGNESIUM: Lab: 1.8 mg/dL (ref 1.6–2.6)

## 2020-02-01 MED ORDER — CEFPODOXIME 200 MG PO TAB
200 mg | ORAL_TABLET | Freq: Two times a day (BID) | ORAL | 0 refills | 7.00000 days | Status: AC
Start: 2020-02-01 — End: ?

## 2020-02-01 MED ORDER — FENTANYL CITRATE (PF) 50 MCG/ML IJ SOLN
50 ug | Freq: Once | INTRAVENOUS | 0 refills | Status: CP
Start: 2020-02-01 — End: ?
  Administered 2020-02-02: 01:00:00 50 ug via INTRAVENOUS

## 2020-02-01 MED ORDER — METOCLOPRAMIDE HCL 10 MG PO TAB
10 mg | ORAL_TABLET | ORAL | 0 refills | Status: AC | PRN
Start: 2020-02-01 — End: ?

## 2020-02-01 MED ORDER — METOCLOPRAMIDE HCL 5 MG/ML IJ SOLN
10 mg | Freq: Once | INTRAVENOUS | 0 refills | Status: CP
Start: 2020-02-01 — End: ?
  Administered 2020-02-02: 01:00:00 10 mg via INTRAVENOUS

## 2020-02-01 MED ORDER — CEFTRIAXONE INJ 1GM IVP
1 g | Freq: Once | INTRAVENOUS | 0 refills | Status: CP
Start: 2020-02-01 — End: ?
  Administered 2020-02-02: 01:00:00 1 g via INTRAVENOUS

## 2020-02-01 MED ORDER — LACTATED RINGERS IV SOLP
30 mL/kg | Freq: Once | INTRAVENOUS | 0 refills | Status: CP
Start: 2020-02-01 — End: ?
  Administered 2020-02-02: 01:00:00 1647 mL via INTRAVENOUS

## 2020-02-01 NOTE — ED Provider Notes
Emily [name] is a 24 y.o. female.    Chief Complaint:  Chief Complaint   Patient presents with   ? Flank Pain     L side, radiates to abd, reprots [redacted] weeks pregnant, reports bladder incontience, reports high risk pregnancy, reports being seen at other ER told it was somthing with her kidney       History of Present Illness:  Pt is a 24 y.o. female with PMH of anxiety/depression, asthma, kidney stones, and Fetal growth restriction, who presents to the ED with c/o L flank pain. Pt reports L flank pain onset yesterday, radiating down to LLQ. Also notes worsening of Hyperemesis gravidarum and urinary frequency/urgency. Also notes one episode of a gush of clear vaginal discharge after she urinated. States she's [redacted] weeks pregnant based on recent US, G8P3. She went to an ED in [place], and was told she may have a kidney infection or kidney stones, but she left AMA to care for her children. She's prescribed Promethazine, Zofran, Azo and Keflex, which she took without relief. States she's unable to keep anything down today. States the Azo turned her urine orange so she's unsure if having hematuria. Otherwise denies any fever, coughs, CP, SOB, diarrhea, dysuria, vaginal bleeding, or any other concerns. Denies concerns for STI or STI tests, stating her recent STI tests were negative. Denies smoking, alcohol or illicit drug use.         Review of Systems:  Review of Systems   Constitutional: Negative for chills and fever.   HENT: Negative.    Eyes: Negative for visual disturbance.   Respiratory: Negative for cough and shortness of breath.    Cardiovascular: Negative for chest pain.   Gastrointestinal: Positive for abdominal pain, nausea and vomiting. Negative for blood in stool, constipation and diarrhea.   Genitourinary: Positive for flank pain (left), frequency, urgency and vaginal discharge (clear). Negative for dysuria, hematuria and vaginal bleeding.   Musculoskeletal: Negative for back pain, neck pain and neck stiffness.   Skin: Negative for color change, rash and wound.   Allergic/Immunologic: Negative for immunocompromised state.   Neurological: Negative for dizziness, seizures, syncope, weakness, numbness and headaches.   Hematological: Does not bruise/bleed easily.   Psychiatric/Behavioral: Negative for confusion.   All other systems reviewed and are negative.      Allergies:  Ibuprofen, Ketorolac tromethamine, Nsaids (non-steroidal anti-inflammatory drug), Amoxicillin, Cherry, Nitrofurantoin monohyd/m-cryst, Ranitidine, and Seasonal allergies    Past Medical History:  Medical History:   Diagnosis Date   ? Anxiety    ? Asthma    ? Depression    ? PTSD (post-traumatic stress disorder)    ? Seizure (HCC)     on depakote until 2019, absent seizures   ? UTI (urinary tract infection)        Past Surgical History:  Surgical History:   Procedure Laterality Date   ? CHOLECYSTECTOMY  2006   ? CHOLECYSTECTOMY  2006   ? ABDOMEN SURGERY      laparascopy 2019   ? CESAREAN SECTION     ? GALLBLADDER SURGERY     ? HX APPENDECTOMY         Pertinent medical/surgical history reviewed  Medical History:   Diagnosis Date   ? Anxiety    ? Asthma    ? Depression    ? PTSD (post-traumatic stress disorder)    ? Seizure (HCC)     on depakote until 2019, absent seizures   ? UTI (urinary tract infection)  Surgical History:   Procedure Laterality Date   ? CHOLECYSTECTOMY  2006   ? CHOLECYSTECTOMY  2006   ? ABDOMEN SURGERY      laparascopy 2019   ? CESAREAN SECTION     ? GALLBLADDER SURGERY     ? HX APPENDECTOMY         Social History:  Social History     Tobacco Use   ? Smoking status: Former Smoker     Packs/day: 1.00     Years: 13.00     Pack years: 13.00     Types: Cigarettes   ? Smokeless tobacco: Never Used   Vaping Use   ? Vaping Use: Every day   ? Substances: Nicotine, Flavoring   Substance Use Topics   ? Alcohol use: Not Currently   ? Drug use: Never     Social History     Substance and Sexual Activity   Drug Use Never Family History:  Family History   Problem Relation Age of Onset   ? Heart Disease Mother    ? Diabetes Mother    ? Hypertension Mother    ? Diabetes Maternal Grandmother    ? Pulmonary Embolism Maternal Grandfather        Vitals:  ED Vitals    Date and Time T BP P RR SPO2P SPO2 User   02/01/20 2048 -- -- 101 16 PER MINUTE -- 99 % SS   02/01/20 2005 -- 107/57 110 18 PER MINUTE -- 99 % SS   02/01/20 1352 36.7 ?C (98.1 ?F) 114/58 108 17 PER MINUTE 108 97 % RH          Physical Exam:  Physical Exam  Vitals and nursing note reviewed. Exam conducted with a chaperone present.   Constitutional:       General: She is awake. She is not in acute distress.     Appearance: Normal appearance. She is well-developed. She is not ill-appearing, toxic-appearing or diaphoretic.   HENT:      Head: Normocephalic and atraumatic.      Right Ear: External ear normal.      Left Ear: External ear normal.      Nose: Nose normal.      Mouth/Throat:      Mouth: Mucous membranes are dry.   Eyes:      Conjunctiva/sclera: Conjunctivae normal.   Cardiovascular:      Rate and Rhythm: Regular rhythm. Tachycardia present.      Pulses: Normal pulses.           Radial pulses are 2+ on the right side and 2+ on the left side.        Dorsalis pedis pulses are 2+ on the right side and 2+ on the left side.      Heart sounds: Normal heart sounds.   Pulmonary:      Effort: Pulmonary effort is normal. No tachypnea or respiratory distress.      Breath sounds: Normal breath sounds and air entry. No stridor. No decreased breath sounds, wheezing, rhonchi or rales.   Abdominal:      General: There is no distension.      Palpations: Abdomen is soft.      Tenderness: There is no abdominal tenderness. There is left CVA tenderness. There is no right CVA tenderness, guarding or rebound.   Genitourinary:     Labia:         Right: No rash, tenderness or lesion.  Left: No rash, tenderness or lesion.       Vagina: Vaginal discharge (white, small amount) present. No tenderness or bleeding.      Cervix: No cervical motion tenderness.      Adnexa:         Right: No tenderness.          Left: No tenderness.        Comments: Cervix os closed, no bleeding or POC visualized.   Exam done with RN Luther Parody as chaperone  Musculoskeletal:         General: No deformity or signs of injury. Normal range of motion.      Cervical back: Normal range of motion and neck supple. No rigidity.      Right lower leg: No edema.      Left lower leg: No edema.   Skin:     General: Skin is warm and dry.      Capillary Refill: Capillary refill takes less than 2 seconds.      Findings: No rash or wound.   Neurological:      General: No focal deficit present.      Mental Status: She is alert and oriented to person, place, and time.      Motor: No weakness.      Gait: Gait normal.   Psychiatric:         Mood and Affect: Mood normal.         Behavior: Behavior normal.         Laboratory Results:  Labs Reviewed   CBC AND DIFF - Abnormal       Result Value Ref Range Status    White Blood Cells 18.0 (*) 4.5 - 11.0 K/UL Final    RBC 4.57  4.0 - 5.0 M/UL Final    Hemoglobin 12.9  12.0 - 15.0 GM/DL Final    Hematocrit 16.1  36 - 45 % Final    MCV 85.0  80 - 100 FL Final    MCH 28.3  26 - 34 PG Final    MCHC 33.3  32.0 - 36.0 G/DL Final    RDW 09.6 (*) 11 - 15 % Final    Platelet Count 197  150 - 400 K/UL Final    MPV 10.2  7 - 11 FL Final    Neutrophils 89 (*) 41 - 77 % Final    Lymphocytes 7 (*) 24 - 44 % Final    Monocytes 4  4 - 12 % Final    Eosinophils 0  0 - 5 % Final    Basophils 0  0 - 2 % Final    Absolute Neutrophil Count 15.97 (*) 1.8 - 7.0 K/UL Final    Absolute Lymph Count 1.25  1.0 - 4.8 K/UL Final    Absolute Monocyte Count 0.69  0 - 0.80 K/UL Final    Absolute Eosinophil Count 0.07  0 - 0.45 K/UL Final    Absolute Basophil Count 0.05  0 - 0.20 K/UL Final    MDW (Monocyte Distribution Width) 27.7 (*) <20.7 Final   COMPREHENSIVE METABOLIC PANEL - Abnormal    Sodium 135 (*) 137 - 147 MMOL/L Final Potassium 3.8  3.5 - 5.1 MMOL/L Final    Chloride 100  98 - 110 MMOL/L Final    Glucose 78  70 - 100 MG/DL Final    Blood Urea Nitrogen 11  7 - 25 MG/DL Final    Creatinine 0.45  0.4 - 1.00 MG/DL  Final    Calcium 9.6  8.5 - 10.6 MG/DL Final    Total Protein 6.9  6.0 - 8.0 G/DL Final    Total Bilirubin 0.8  0.3 - 1.2 MG/DL Final    Albumin 4.2  3.5 - 5.0 G/DL Final    Alk Phosphatase 96  25 - 110 U/L Final    AST (SGOT) 18  7 - 40 U/L Final    CO2 21  21 - 30 MMOL/L Final    ALT (SGPT) 17  7 - 56 U/L Final    Anion Gap 14 (*) 3 - 12 Final    eGFR Non African American >60  >60 mL/min Final    eGFR African American >60  >60 mL/min Final   URINALYSIS DIPSTICK REFLEX TO CULTURE - Abnormal    Color,UA AMBER   Final    Turbidity,UA 1+ (*) CLEAR-CLEAR Final    Specific Gravity-Urine 1.024  1.005 - 1.030 Final    pH,UA 6.0  5.0 - 8.0 Final    Protein,UA 2+ (*) NEG-NEG Final    Glucose,UA NEG  NEG-NEG Final    Ketones,UA 1+ (*) NEG-NEG Final    Bilirubin,UA NEG  NEG-NEG Final    Blood,UA 2+ (*) NEG-NEG Final    Urobilinogen,UA INCREASED (*) NORM-NORMAL Final    Nitrite,UA POS (*) NEG-NEG Final    Leukocytes,UA 1+ (*) NEG-NEG Final    Urine Ascorbic Acid, UA NEG  NEG-NEG Final   BETA-HCG - Abnormal    Beta-HCG,Serum 54,175 (*) <5 U/L Final   DIRECT EXAM (WET PREP)    Battery Name DIRECT EXAM,WET PREP   Final    Report Status FINAL 02/01/2020   Final    Specimen Description SWAB ENDOCERVICAL     Final    Special Requests No special requests   Final    Direct Exam NO YEAST SEEN   Final    Direct Exam NO CLUE CELLS SEEN   Final    Direct Exam NO TRICHOMONAS SEEN   Final   CULTURE-BLOOD W/SENSITIVITY   CULTURE-BLOOD W/SENSITIVITY   MAGNESIUM    Magnesium 1.8  1.6 - 2.6 mg/dL Final   URINALYSIS MICROSCOPIC REFLEX TO CULTURE    WBCs,UA PACKED  0 - 2 /HPF Final    RBCs,UA 20-50  0 - 3 /HPF Final    Comment,UA     Final    Value: Criteria for reflex to culture are WBC>10, Positive Nitrite, and/or >=+1   leukocytes. If quantity is not sufficient, an addendum will follow.      MucousUA TRACE   Final    WBC Clumps PRESENT   Final    Squamous Epithelial Cells 0-2  0 - 5 Final   POC CREATININE, RAD    Creatinine, POC 0.5  0.4 - 1.00 MG/DL Final   POC LACTATE    LACTIC ACID POC 1.2  0.5 - 2.0 MMOL/L Final   UA REFLEX LABEL          Radiology Interpretation:    US RENAL BLADDER COMPLETE   Final Result         1.  Probable tiny nonobstructing left lower pole renal calculus. Otherwise normal-appearing kidneys without hydronephrosis.   2.  Borderline circumferential wall thickened appearance the urinary bladder which may be due to incomplete distention and/or cystitis.      By my electronic signature, I attest that I have personally reviewed the images for this examination and formulated the interpretations and opinions expressed in this  report          Finalized by Sherolyn Buba, M.D. on 02/01/2020 7:00 PM. Dictated by Precious Reel, M.D. on 02/01/2020 6:52 PM.         US OB LT 14 WKS TRANSAB 1ST GEST   Final Result         Single intrauterine pregnancy with an estimated gestational age of [redacted] weeks, 3 days, corresponding to an estimated date of delivery of 08/05/2020.      By my electronic signature, I attest that I have personally reviewed the images for this examination and formulated the interpretations and opinions expressed in this report          Finalized by Sherolyn Buba, M.D. on 02/01/2020 7:02 PM. Dictated by Precious Reel, M.D. on 02/01/2020 6:33 PM.           EKG:  None    ED Course:    Pt seen and evaluated in the ED for L flank pain in pregnancy  Left AMA from [facility] ED with Rx for Keflex for UTI. Pt claimed unable to keep anything down today.   Physical exam significant for tachycardia, dry mucous membranes, and left CVA tenderness.  Labs reviewed, WBC 18.0, lactate 1.2, sodium 135, beta-hcg 54175. UA with 2+ blood, pos nitrite, packed WBC, 20-50 RBC. Urine cultures pending. Blood cultures ordered.  Pt's tachycardia likely 2/2 dehydration and pain. Improved with IV LR, Reglan and Fentanyl. Pt's afebrile, remaining labs reassuring. Low suspicion for sepsis.   [place] reviewed, noted for Probable tiny nonobstructing left lower pole renal calculus. Otherwise normal-appearing kidneys without hydronephrosis. Borderline circumferential wall thickened appearance the urinary bladder which may be due to incomplete distention and/or cystitis.   Rocephin given.   OB-GYN consulted for admission, however pt insisted on leaving and managing her symptoms at thome. OK per OB-GYN to d/c home with close follow up. No further interventions at this time.  Pt tolerated PO challenge.   Reglan prescribed. Switched abx to Cefpodoxime to cover pyelonephritis.    Discussed outpatient management and follow up with OB-GYN/PCP.   Discussed close monitoring and strict return precautions.  All questions fully answered. Pt agreeable to plan.   Dismissed in stable condition.       MDM  Reviewed: previous chart, nursing note and vitals  Interpretation: labs and ultrasound  Consults: OB/GYN        Facility Administered Meds:  Medications   fentaNYL citrate PF (SUBLIMAZE) injection 50 mcg (50 mcg Intravenous Given 02/01/20 1835)   metoclopramide hcl (REGLAN) injection 10 mg (10 mg Intravenous Given 02/01/20 1833)   cefTRIAXone (ROCEPHIN) IVP 1 g (1 g Intravenous Given 02/01/20 1914)   lactated ringers infusion (0 mL/kg ? 54.9 kg Intravenous Infusion Stopped 02/01/20 2039)         Clinical Impression:  Clinical Impression   Pyelonephritis   Hyponatremia   Renal calculus, left   [redacted] weeks gestation of pregnancy   Dehydration   Nausea/vomiting in pregnancy       Disposition/Follow up  ED Disposition     ED Disposition    Discharge        Burnadette Pop, MD  [address]  [place] [address]  [phone]    Schedule an appointment as soon as possible for a visit       Obstetrics and Gynecology: [facility]  [address]  [address]  [place] [address]  [phone]  Schedule an appointment as soon as  possible for a visit         Medications:  Discharge Medication List as of 02/01/2020  8:43 PM      START taking these medications    Details   cefpodoxime (VANTIN) 200 mg tablet Take one tablet by mouth every 12 hours for 10 days. Take with food., Disp-20 tablet, R-0, Normal      metoclopramide HCL (REGLAN) 10 mg tablet Take one tablet by mouth every 6 hours as needed for Nausea or Vomiting., Disp-30 tablet, R-0, Normal             Procedure Notes:  Procedures        Attestation / Supervision:    IOtis Peak, APRN-NP, personally performed the services described in this documentation and it is both accurate and complete.    Otis Peak, APRN-NP

## 2020-02-01 NOTE — Progress Notes
Pt calls and c/o N&V, left flank pain that is stabbing and wraps around to abdomen since 1100 am yesterday 01/31/20.  Pt states she went to hospital in [place] last evening and they thought she had a UTI and possible kidney stone.  She received 1 dose of IV antibiotics and IV zofran but had to leave AMA due to child care issues.  She is still having symptoms and Dr. Maisie Fus and she recommended pt to go to ED for evaluation.  Pt verbalized correct understanding and states she will come to [facility] ED.

## 2020-02-02 NOTE — ED Notes
Discharge instructions and prescriptions discussed with Pt. Pt verbalized understanding to follow up with PCP and come to ED if symptoms worsen. No further questions or concerns at this time. Pt alert & oriented. Pt ambulated with steady gait out of department with all of pt belongings.

## 2020-02-02 NOTE — ED Notes
Patient requesting to go home instead of being being admitted to ED d/t long wait times. APRN notified. Patient refusing BC x2 for this RN. Patient OK with receiving IVF and abx.

## 2020-02-05 ENCOUNTER — Encounter: Admit: 2020-02-05 | Discharge: 2020-02-05 | Payer: MEDICAID

## 2020-02-05 DIAGNOSIS — O2341 Unspecified infection of urinary tract in pregnancy, first trimester: Secondary | ICD-10-CM

## 2020-02-05 MED ORDER — SULFAMETHOXAZOLE-TRIMETHOPRIM 800-160 MG PO TAB
1 | ORAL_TABLET | Freq: Two times a day (BID) | ORAL | 0 refills | Status: AC
Start: 2020-02-05 — End: ?

## 2020-02-05 NOTE — Telephone Encounter
-----   Message from Marda Stalker, MD sent at 02/05/2020  3:41 PM CST -----  Regarding: switch antibiotic  Hello!    Please call her and send in a new antibiotic. Stop the keflex and start Bactrim DS 800/160 one tablet PO q 12 hours X 3 days, #6, no refills.     Thank you!  Angie   ----- Message -----  From: Lilian Coma, RN  Sent: 02/05/2020  11:36 AM CST  To: Marda Stalker, MD    Pt was seen in ED 12/9 for presumed pyelonephritis. She left AMA. Her urine culture came back positive for ENTEROCOCCUS FAECALIS. I was told by microbiology that keflex doesn't work against this bacteria. I spoke with her earlier and her symptoms have improved some- no fever but she does still have back pain.    Would you like to switch her to a different antibiotic?    Delice Bison

## 2020-02-05 NOTE — Telephone Encounter
Notified patient that Dr. Daphine Deutscher reviewed her urine culture results and would like her to stop Keflex and begin taking Bactrim DS (800/160 tablets)- one tablet by mouth every 12 hours for 3 days.    Verified correct pharmacy with patient. Reinforced that if her symptoms worsen prior to her appointment that she should come in to the ED.Also discussed that her genetic results had not yet returned- encouraged her to call or send MyChart message later in the week and we will check again. Patient verbalized understanding.

## 2020-02-05 NOTE — Telephone Encounter
Called per Dr. Sherilyn Dacosta request to get patient into clinic this week to follow up on ED visit regarding presumed pyelonephritis.    Pt reports she is unable to come this week. Scheduled for 12/20- patient reports symptoms are better but she still has the back pain. She denies fevers.    She had questions about her urine culture and b-HCG results. Reassured her that b-HCG results were normal. Urine culture shows bacteria- she is currently on keflex. I will verify this is the correct medication based on susceptibility and send patient MyChart message when I confirm.    Pt educated that if her symptoms worsen or she gets a fever she should return for evaluation in the ED.    She v/u.

## 2020-02-07 ENCOUNTER — Encounter: Admit: 2020-02-07 | Discharge: 2020-02-07 | Payer: MEDICAID

## 2020-02-07 NOTE — Telephone Encounter
Tc from pt stating she is having her car serviced and will not have transportation to her appointment on Monday. Pt asking if she can wait until her appointment on 02/26/2020. Pt states she is no longer feeling the kidney infection symptoms and has two antibiotics left. Per Dr. Daphine Deutscher pt ok to wait until her appointment on 02/26/2020. Pt informed to seek evaluation if symptoms return. Pt verbalized understanding.

## 2020-02-07 NOTE — Telephone Encounter
Results of non-invasive prenatal screening indicate a low risk for Trisomy 13, 18 and 21.    Chromosome Result Probability   Trisomy 21 Low risk <1 in 10,000   Trisomy 18 Low risk <1 in 10,000   Trisomy 13 Low risk <1 in 10,000   Monosomy X Low risk <1 in 10,000   Triploidy/Vanishing Twin Low risk N/A   22q11.2 deletion Low risk 1 in 4   Fetal Sex Female >99 percent   Fetal Fraction 9.4%      The patient was counseled that these results are limited to the determination of aneuploidy only for the chromosomes tested, and not a complete analysis of all chromosomes.  Results from this test do not eliminate the possibility that other chromosomal abnormalities may exist in this pregnancy and a negative result does not ensure an unaffected pregnancy.  While results of this testing are highly accurate, not all chromosome abnormalities may be detected due to placental, maternal or fetal mosaicism, or other causes.  If additional prenatal findings are highly suggestive of aneuploidy, amniocentesis would be recommended.    An expanded screening panel for recessive genetic disorders was performed on the patient. Below are listed results for the most common genetic disorders recommended by ACOG and ACMG.  Results of the remaining disorders may be reviewed on the laboratory report.  The results reflect that [place] is of Caucasian ancestry.  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 Tyrosinemia Type 1 is a recessive amino acid disorder which causes toxic buildup of tyrosine and other amino acids in the body. If untreated, symptoms develop in infancy and include diarrhea, bloody stool, vomiting, swollen abdomen, poor weight gain, lethargy, irritability, jaundice, cabbage-like odor, bleeding problems, and developmental delay. Lifelong dietary and medical treatment is necessary.   The patient should not have any signs or symptoms of these disorders since she is only a carrier and not affected.   We would recommend carrier screening for the patient's partner to determine the risk to have an affected child.   The patient was reminded that carrier screening may reduce but not eliminate the risk for the other tested conditions.  In addition, this is not an exhaustive test for genetic disorders.  These results were communicated to the patient

## 2020-02-08 ENCOUNTER — Encounter: Admit: 2020-02-08 | Discharge: 2020-02-08 | Payer: MEDICAID

## 2020-02-12 ENCOUNTER — Encounter: Admit: 2020-02-12 | Discharge: 2020-02-12 | Payer: MEDICAID

## 2020-02-12 DIAGNOSIS — Z1379 Encounter for other screening for genetic and chromosomal anomalies: Secondary | ICD-10-CM

## 2020-02-12 NOTE — Progress Notes
Pt calls and states she passed a 2-3cm kidney stone on Saturday.  She is still having some blood in her urine and a slight burning at the beginning of urination.  She denies fever or any other UTI symptoms.  She states she has completed her abx and is drinking plenty of fluids.  Discussed with Dr. Ann Maki and he states she may experience some mild bleeding after passing stone.  If pt develops any signs of UTI she will need to come in for evaluation or urine testing.  Pt verbalized correct understanding.

## 2020-02-26 ENCOUNTER — Ambulatory Visit: Admit: 2020-02-26 | Discharge: 2020-02-27 | Payer: MEDICAID

## 2020-02-26 ENCOUNTER — Encounter: Admit: 2020-02-26 | Discharge: 2020-02-26 | Payer: MEDICAID

## 2020-02-26 DIAGNOSIS — N3001 Acute cystitis with hematuria: Secondary | ICD-10-CM

## 2020-02-26 DIAGNOSIS — O0992 Supervision of high risk pregnancy, unspecified, second trimester: Secondary | ICD-10-CM

## 2020-02-26 DIAGNOSIS — R87613 High grade squamous intraepithelial lesion on cytologic smear of cervix (HGSIL): Secondary | ICD-10-CM

## 2020-02-26 DIAGNOSIS — F32A Depression: Secondary | ICD-10-CM

## 2020-02-26 DIAGNOSIS — Z98891 History of uterine scar from previous surgery: Secondary | ICD-10-CM

## 2020-02-26 DIAGNOSIS — J8283 Eosinophilic asthma: Secondary | ICD-10-CM

## 2020-02-26 DIAGNOSIS — J45909 Unspecified asthma, uncomplicated: Secondary | ICD-10-CM

## 2020-02-26 DIAGNOSIS — R569 Unspecified convulsions: Secondary | ICD-10-CM

## 2020-02-26 DIAGNOSIS — Z8759 Personal history of other complications of pregnancy, childbirth and the puerperium: Secondary | ICD-10-CM

## 2020-02-26 DIAGNOSIS — F419 Anxiety disorder, unspecified: Secondary | ICD-10-CM

## 2020-02-26 DIAGNOSIS — N390 Urinary tract infection, site not specified: Secondary | ICD-10-CM

## 2020-02-26 DIAGNOSIS — O26839 Pregnancy related renal disease, unspecified trimester: Secondary | ICD-10-CM

## 2020-02-26 DIAGNOSIS — F4310 Post-traumatic stress disorder, unspecified: Secondary | ICD-10-CM

## 2020-02-26 MED ORDER — CEPHALEXIN 500 MG PO CAP
500 mg | ORAL_CAPSULE | Freq: Every evening | ORAL | 7 refills | Status: AC
Start: 2020-02-26 — End: ?

## 2020-02-26 NOTE — Assessment & Plan Note
-   G1 was SVD at term  - G2 was 38 week pre-labor CS due to FGR with non-reassuring fetal surveillance  - G3 was successful VBAC  - Counseled, would like TOLAC if there are no contraindications

## 2020-02-26 NOTE — Progress Notes
Maternal fetal medicine follow up visit    Emily [name] is a 25 y.o. [id number] at [redacted]w[redacted]d by 7 week OSH sono, consistent with our 12 week sono, Estimated Date of Delivery: 08/06/20. Presents for follow up for the following issues:    Problem   History of Cesarean Section   History of Prior Pregnancy With Iugr Newborn   Eosinophilic Asthma   High Grade Squamous Intraepithelial Lesion (Hgsil) On Cytologic Smear of Cervix   Anxiety and Depression   Calculus of Kidney Affecting Pregnancy, Antepartum   High-Risk Pregnancy in Second Trimester   Acute Cystitis With Hematuria       S: Today she is overall feeling well. Denies any contractions. LOF, VB. Denies urinary tract/renal stone symptoms after antibiotic and passing large stone at home on 12/20    O:  BP 99/66 (BP Source: Arm, Right Upper, Patient Position: Sitting)  - Pulse 102  - Temp 36.7 ?C (98 ?F)  - Ht 147.3 cm (58)  - Wt 54 kg (119 lb)  - BMI 24.87 kg/m?    General: NAD  Abd: soft, nt, gravid  Ext: no LE edema b/l  FHR: obtained by Doppler      A/P: 25 y.o. [id number] at Unknown with the following issues that were discussed     History of cesarean section  - G1 was SVD at term  - G2 was 38 week pre-labor CS due to FGR with non-reassuring fetal surveillance  - G3 was successful VBAC  - Counseled, would like TOLAC if there are no contraindications     History of prior pregnancy with IUGR newborn  - Her second pregnancy/delivery was FGR fetus that required delivery at 38 weeks  - Will have growth q 4 weeks after 20 weeks    Eosinophilic asthma  - Mild intermittent; Current meds: albuterol ~1/week or less  - Discussed 1/3 of patients stabilize/worsen/improve in pregnancy and importance of alerting OB team if she finds she is requiring increased albuterol     High grade squamous intraepithelial lesion (HGSIL) on cytologic smear of cervix  - At first appointment, she reported most recent pap in [place] was negative  - Request records    Anxiety and depression  - Counseled by Dr. Clarisa Fling on 01/29/20  - Previously on Lunesta and benzo to assist with sleep  - Referral to Dr. Bertram Millard - scheduled on 03/13/20, but she is going to call and try to switch to telehealth appointment     Calculus of kidney affecting pregnancy, antepartum  - Recently passed a large stone at home (02/12/20)  - UA WNL today (02/26/20)  - Reports many UTI and kidney stones in the past.  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 Also reports that she required a stent in one of her pregnancies because she couldn't empty her bladder.   - Discussed ppx antibiotic q HS to prevent UTI and pyelonephritis during pregnancy. Allergy to [facility], therefore Keflex sent to her pharmacy in [place].   - No obvious anomalies during previous workups.     High-risk pregnancy in second trimester  - PNL reviewed and WNL, Rh +, RI  - Need to request pap smear   - NT WNL with negative NIPS  - DUS in 3-4 weeks with next office visit  - Declined flu and COVID.  Extensively counseled on risks of COVID during pregnancy today (02/26/20)    Acute cystitis  with hematuria  - E. Faecalis UTI on 02/01/20, s/p treatment with Keflex  - Also associated with kidney stone.   - After antibiotics and passing large stone at home, her symptoms resolved  - Discussed ppx Keflex today (02/26/20) - Rx sent to pharmacy (allergy to Macrobid)  - UCx TOC next appointment.         Orders Placed This Encounter   ? POC URINE DIPSTICK MANUAL READ   ? cephalexin (KEFLEX) 500 mg capsule        Total Time Today was 30 minutes in the following activities: Preparing to see the patient, Obtaining and/or reviewing separately obtained history, Counseling and educating the patient/family/caregiver and Documenting clinical information in the electronic or other health record    Marda Stalker, MD

## 2020-02-27 NOTE — Assessment & Plan Note
-   PNL reviewed and WNL, Rh +, RI  - Need to request pap smear   - NT WNL with negative NIPS  - DUS in 3-4 weeks with next office visit  - Declined flu and COVID.  Extensively counseled on risks of COVID during pregnancy today (02/26/20)

## 2020-02-27 NOTE — Assessment & Plan Note
-   Counseled by Dr. Clarisa Fling on 01/29/20  - Previously on Lunesta and benzo to assist with sleep  - Referral to Dr. Bertram Millard - scheduled on 03/13/20, but she is going to call and try to switch to telehealth appointment

## 2020-02-27 NOTE — Assessment & Plan Note
-   Recently passed a large stone at home (02/12/20)  - UA WNL today (02/26/20)  - Reports many UTI and kidney stones in the past.  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 Also reports that she required a stent in one of her pregnancies because she couldn't empty her bladder.   - Discussed ppx antibiotic q HS to prevent UTI and pyelonephritis during pregnancy. Allergy to [facility], therefore Keflex sent to her pharmacy in [place].   - No obvious anomalies during previous workups.

## 2020-02-27 NOTE — Assessment & Plan Note
-   At first appointment, she reported most recent pap in [facility] was negative  - Request records

## 2020-02-27 NOTE — Assessment & Plan Note
-   E. Faecalis UTI on 02/01/20, s/p treatment with Keflex  - Also associated with kidney stone.   - After antibiotics and passing large stone at home, her symptoms resolved  - Discussed ppx Keflex today (02/26/20) - Rx sent to pharmacy (allergy to [facility]ro[facility])  - UCx TOC next appointment.

## 2020-02-27 NOTE — Assessment & Plan Note
-   Her second pregnancy/delivery was FGR fetus that required delivery at 38 weeks  - Will have growth q 4 weeks after 20 weeks

## 2020-03-13 ENCOUNTER — Encounter: Admit: 2020-03-13 | Discharge: 2020-03-13 | Payer: MEDICAID

## 2020-03-13 ENCOUNTER — Ambulatory Visit: Admit: 2020-03-13 | Discharge: 2020-03-14 | Payer: MEDICAID

## 2020-03-13 DIAGNOSIS — F4310 Post-traumatic stress disorder, unspecified: Secondary | ICD-10-CM

## 2020-03-13 DIAGNOSIS — O99340 Other mental disorders complicating pregnancy, unspecified trimester: Secondary | ICD-10-CM

## 2020-03-13 DIAGNOSIS — O99342 Other mental disorders complicating pregnancy, second trimester: Secondary | ICD-10-CM
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
## 2020-03-14 DIAGNOSIS — O0991 Supervision of high risk pregnancy, unspecified, first trimester: Secondary | ICD-10-CM

## 2020-03-18 ENCOUNTER — Encounter: Admit: 2020-03-18 | Discharge: 2020-03-18 | Payer: MEDICAID

## 2020-03-26 ENCOUNTER — Encounter: Admit: 2020-03-26 | Discharge: 2020-03-26 | Payer: MEDICAID

## 2020-03-26 DIAGNOSIS — O26839 Pregnancy related renal disease, unspecified trimester: Secondary | ICD-10-CM

## 2020-03-26 DIAGNOSIS — J8283 Eosinophilic asthma: Secondary | ICD-10-CM

## 2020-03-27 NOTE — Assessment & Plan Note
-   G1 was SVD at term  - G2 was 38 week pre-labor CS due to FGR with non-reassuring fetal surveillance  - G3 was successful VBAC  - Counseled, would like TOLAC if there are no contraindications

## 2020-03-27 NOTE — Assessment & Plan Note
-   Her second pregnancy/delivery was FGR fetus that required delivery at 38 weeks  - Will have growth q 4 weeks after 20 weeks

## 2020-03-28 ENCOUNTER — Encounter: Admit: 2020-03-28 | Discharge: 2020-03-28 | Payer: MEDICAID

## 2020-03-28 ENCOUNTER — Ambulatory Visit: Admit: 2020-03-28 | Discharge: 2020-03-28 | Payer: MEDICAID

## 2020-03-28 DIAGNOSIS — O26872 Cervical shortening, second trimester: Secondary | ICD-10-CM

## 2020-03-28 DIAGNOSIS — Z87440 Personal history of urinary (tract) infections: Secondary | ICD-10-CM

## 2020-03-28 DIAGNOSIS — O26839 Pregnancy related renal disease, unspecified trimester: Secondary | ICD-10-CM

## 2020-03-28 DIAGNOSIS — O26879 Cervical shortening, unspecified trimester: Secondary | ICD-10-CM

## 2020-03-28 DIAGNOSIS — N3001 Acute cystitis with hematuria: Secondary | ICD-10-CM

## 2020-03-28 DIAGNOSIS — Z98891 History of uterine scar from previous surgery: Secondary | ICD-10-CM

## 2020-03-28 DIAGNOSIS — Z3A21 21 weeks gestation of pregnancy: Secondary | ICD-10-CM

## 2020-03-28 DIAGNOSIS — R87613 High grade squamous intraepithelial lesion on cytologic smear of cervix (HGSIL): Secondary | ICD-10-CM

## 2020-03-28 DIAGNOSIS — N390 Urinary tract infection, site not specified: Secondary | ICD-10-CM

## 2020-03-28 DIAGNOSIS — O0992 Supervision of high risk pregnancy, unspecified, second trimester: Secondary | ICD-10-CM

## 2020-03-28 DIAGNOSIS — F32A Depression: Secondary | ICD-10-CM

## 2020-03-28 DIAGNOSIS — J8283 Eosinophilic asthma: Secondary | ICD-10-CM

## 2020-03-28 DIAGNOSIS — O99891 Back pain affecting pregnancy in second trimester: Secondary | ICD-10-CM

## 2020-03-28 DIAGNOSIS — F419 Anxiety disorder, unspecified: Secondary | ICD-10-CM

## 2020-03-28 DIAGNOSIS — F4310 Post-traumatic stress disorder, unspecified: Secondary | ICD-10-CM

## 2020-03-28 DIAGNOSIS — R569 Unspecified convulsions: Secondary | ICD-10-CM

## 2020-03-28 DIAGNOSIS — J45909 Unspecified asthma, uncomplicated: Secondary | ICD-10-CM

## 2020-03-28 DIAGNOSIS — Z8759 Personal history of other complications of pregnancy, childbirth and the puerperium: Secondary | ICD-10-CM

## 2020-03-28 DIAGNOSIS — O99342 Other mental disorders complicating pregnancy, second trimester: Secondary | ICD-10-CM

## 2020-03-28 LAB — URINALYSIS, MICROSCOPIC

## 2020-03-28 MED ORDER — CYCLOBENZAPRINE 5 MG PO TAB
5 mg | ORAL_TABLET | Freq: Three times a day (TID) | ORAL | 0 refills | 21.00000 days | Status: DC
Start: 2020-03-28 — End: 2020-03-28

## 2020-03-28 MED ORDER — PROGESTERONE MICRONIZED 200 MG PO CAP
ORAL_CAPSULE | Freq: Every evening | 1 refills | 30.00000 days | Status: AC
Start: 2020-03-28 — End: ?

## 2020-03-28 MED ORDER — CYCLOBENZAPRINE 5 MG PO TAB
5 mg | ORAL_TABLET | Freq: Every evening | ORAL | 0 refills | 21.00000 days | Status: AC | PRN
Start: 2020-03-28 — End: ?

## 2020-03-28 NOTE — Assessment & Plan Note
   Patient had CL on 03/28/20 of 2.45cm   Patient desires vaginal progesterone   Will recheck CL in 1 week   Briefly discussed cerclage

## 2020-03-28 NOTE — Progress Notes
Emily [name] presents for an ultrasound encounter. Past Medical, Surgical, Family & Social History; Medications & Allergies contained in the electronic record below were not reviewed today and may not be up-to-date. Please see A/S OBGYN report for all documentation related to this encounter.    03/28/2020  Emily [name]

## 2020-04-02 ENCOUNTER — Encounter: Admit: 2020-04-02 | Discharge: 2020-04-02 | Payer: MEDICAID

## 2020-04-02 DIAGNOSIS — O26872 Cervical shortening, second trimester: Secondary | ICD-10-CM

## 2020-04-02 NOTE — Progress Notes
Emily [name] presents for an ultrasound encounter. Past Medical, Surgical, Family & Social History; Medications & Allergies contained in the electronic record below were not reviewed today and may not be up-to-date. Please see A/S OBGYN report for all documentation related to this encounter.    04/02/2020  Nicanor Bake

## 2020-04-09 ENCOUNTER — Encounter

## 2020-04-09 DIAGNOSIS — O26879 Cervical shortening, unspecified trimester: Secondary | ICD-10-CM

## 2020-04-09 DIAGNOSIS — M545 Chronic left-sided low back pain without sciatica: Secondary | ICD-10-CM

## 2020-04-09 DIAGNOSIS — O0992 Supervision of high risk pregnancy, unspecified, second trimester: Secondary | ICD-10-CM

## 2020-04-09 DIAGNOSIS — Z8759 Personal history of other complications of pregnancy, childbirth and the puerperium: Secondary | ICD-10-CM

## 2020-04-09 DIAGNOSIS — O26872 Cervical shortening, second trimester: Secondary | ICD-10-CM

## 2020-04-09 DIAGNOSIS — Z3A23 23 weeks gestation of pregnancy: Secondary | ICD-10-CM

## 2020-04-09 DIAGNOSIS — O99891 Back pain affecting pregnancy in second trimester: Secondary | ICD-10-CM

## 2020-04-09 DIAGNOSIS — Z98891 History of uterine scar from previous surgery: Secondary | ICD-10-CM

## 2020-04-09 MED ORDER — GABAPENTIN 100 MG PO CAP
100 mg | ORAL_CAPSULE | ORAL | 1 refills | Status: AC
Start: 2020-04-09 — End: ?

## 2020-04-09 NOTE — Progress Notes
Emily [name] presents for an ultrasound encounter. Past Medical, Surgical, Family & Social History; Medications & Allergies contained in the electronic record below were not reviewed today and may not be up-to-date. Please see A/S OBGYN report for all documentation related to this encounter.    04/09/2020  Stoney Bang
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
## 2020-04-15 NOTE — Telephone Encounter
RN spoke to patient about her Gentic testing results. Patient has requested results be faxed to [facility] at [phone]. Patient also has a copy she will look for and if unable to find will get a printed copy from her doctors office prior to appointment on 2/24 with provider.

## 2020-04-17 ENCOUNTER — Encounter: Admit: 2020-04-17 | Discharge: 2020-04-17 | Payer: MEDICAID

## 2020-04-18 ENCOUNTER — Encounter: Admit: 2020-04-18 | Discharge: 2020-04-18 | Payer: MEDICAID

## 2020-04-18 ENCOUNTER — Ambulatory Visit: Admit: 2020-04-18 | Discharge: 2020-04-19 | Payer: MEDICAID

## 2020-04-18 DIAGNOSIS — O99340 Other mental disorders complicating pregnancy, unspecified trimester: Secondary | ICD-10-CM

## 2020-04-18 DIAGNOSIS — F4310 Post-traumatic stress disorder, unspecified: Secondary | ICD-10-CM

## 2020-04-18 DIAGNOSIS — O99342 Other mental disorders complicating pregnancy, second trimester: Secondary | ICD-10-CM

## 2020-04-18 MED ORDER — MIRTAZAPINE 15 MG PO TAB
15 mg | ORAL_TABLET | Freq: Every evening | ORAL | 1 refills | Status: AC
Start: 2020-04-18 — End: ?

## 2020-04-23 ENCOUNTER — Encounter: Admit: 2020-04-23 | Discharge: 2020-04-23 | Payer: MEDICAID

## 2020-04-23 DIAGNOSIS — O99891 Back pain affecting pregnancy in second trimester: Secondary | ICD-10-CM

## 2020-04-23 DIAGNOSIS — O99340 Other mental disorders complicating pregnancy, unspecified trimester: Secondary | ICD-10-CM

## 2020-04-23 DIAGNOSIS — O0992 Supervision of high risk pregnancy, unspecified, second trimester: Secondary | ICD-10-CM

## 2020-04-23 DIAGNOSIS — O26839 Pregnancy related renal disease, unspecified trimester: Secondary | ICD-10-CM

## 2020-04-23 DIAGNOSIS — Z8759 Personal history of other complications of pregnancy, childbirth and the puerperium: Secondary | ICD-10-CM

## 2020-04-23 MED ORDER — GABAPENTIN 100 MG PO CAP
200 mg | ORAL_CAPSULE | ORAL | 1 refills | Status: AC
Start: 2020-04-23 — End: ?

## 2020-04-23 MED ORDER — CYCLOBENZAPRINE 5 MG PO TAB
10 mg | ORAL_TABLET | Freq: Every evening | ORAL | 1 refills | 30.00000 days | Status: AC | PRN
Start: 2020-04-23 — End: ?

## 2020-04-23 NOTE — Progress Notes
Emily [name] presents for an ultrasound encounter. Past Medical, Surgical, Family & Social History; Medications & Allergies contained in the electronic record below were not reviewed today and may not be up-to-date. Please see A/S OBGYN report for all documentation related to this encounter.    04/23/2020  Emily [name]

## 2020-05-01 ENCOUNTER — Encounter: Admit: 2020-05-01 | Discharge: 2020-05-01 | Payer: MEDICAID

## 2020-05-07 ENCOUNTER — Encounter: Admit: 2020-05-07 | Discharge: 2020-05-07 | Payer: MEDICAID

## 2020-05-14 ENCOUNTER — Encounter: Admit: 2020-05-14 | Discharge: 2020-05-14 | Payer: MEDICAID

## 2020-05-21 ENCOUNTER — Ambulatory Visit: Admit: 2020-05-21 | Discharge: 2020-05-22 | Payer: MEDICAID

## 2020-05-21 ENCOUNTER — Encounter: Admit: 2020-05-21 | Discharge: 2020-05-21 | Payer: MEDICAID

## 2020-05-21 DIAGNOSIS — Z3A29 29 weeks gestation of pregnancy: Secondary | ICD-10-CM

## 2020-05-21 DIAGNOSIS — O0993 Supervision of high risk pregnancy, unspecified, third trimester: Secondary | ICD-10-CM

## 2020-05-21 DIAGNOSIS — Z8759 Personal history of other complications of pregnancy, childbirth and the puerperium: Secondary | ICD-10-CM

## 2020-05-21 DIAGNOSIS — F4310 Post-traumatic stress disorder, unspecified: Secondary | ICD-10-CM

## 2020-05-21 DIAGNOSIS — Z98891 History of uterine scar from previous surgery: Secondary | ICD-10-CM

## 2020-05-21 DIAGNOSIS — O99810 Abnormal glucose complicating pregnancy: Secondary | ICD-10-CM

## 2020-05-21 DIAGNOSIS — O26839 Pregnancy related renal disease, unspecified trimester: Secondary | ICD-10-CM

## 2020-05-21 DIAGNOSIS — O99343 Other mental disorders complicating pregnancy, third trimester: Secondary | ICD-10-CM

## 2020-05-21 DIAGNOSIS — O99340 Other mental disorders complicating pregnancy, unspecified trimester: Secondary | ICD-10-CM

## 2020-05-21 DIAGNOSIS — R87613 High grade squamous intraepithelial lesion on cytologic smear of cervix (HGSIL): Secondary | ICD-10-CM

## 2020-05-21 MED ORDER — FLUVOXAMINE 25 MG PO TAB
ORAL_TABLET | Freq: Every evening | ORAL | 0 refills | Status: AC
Start: 2020-05-21 — End: ?

## 2020-05-21 MED ORDER — BLOOD-GLUCOSE METER MISC KIT
1 | Freq: Before meals | 0 refills | 50.00000 days | Status: AC
Start: 2020-05-21 — End: ?

## 2020-05-21 MED ORDER — LANCETS 30 GAUGE MISC MISC
Freq: Four times a day (QID) | 3 refills | Status: AC
Start: 2020-05-21 — End: ?

## 2020-05-21 MED ORDER — BLOOD SUGAR DIAGNOSTIC MISC STRP
ORAL_STRIP | Freq: Four times a day (QID) | 3 refills | 30.00000 days | Status: AC
Start: 2020-05-21 — End: ?

## 2020-05-21 NOTE — Progress Notes
Date of Service: 05/21/2020    Subjective:      Emily [name] is a 25 y.o. female  [phone]    Due to COVID-19 pandemic temporary changes to care delivery, this encounter was completed by video through Zoom/MyChart.   Consent to treat by this means was agreed to by Emily [name].     History of Present Illness   Emily [name] is a 25 y.o. female  with CPTSD, depression, and anxiety affecting pregnancy who is seen today for f/u. I last saw her a month ago and started mirtazapine.     Since her last visit, was having side effects from mirtazapine and ultimately discontinued. She reports that she went [name] contact with [her] entire family at the end of February. Her family made false accusations against her in [place], so pt is now fighting criminal charges in that state. Pt reports that she was not even living in the state of [place] at the time that the alleged incident took place. Mom went to the DA later and admitted that she had lied about the accusation, but they still have to investigate because of the nature of the allegation. Anxiety has been high as a result of this situation and the possibility of losing her children if the case is not thrown out. Has an ultrasound today and a clinic visit. Reports that she failed her 1-hour glucose test and isn't sure whether she can manage the 3 hour test 2/2 child care needs. She continues to struggle with low appetite and poor sleep quality. She does not think that she is losing weight but will be weighed at her appointment today.     Med Trials: most recently was taking alprazolam 2mg  tid, Vrylar, and Lunesta.     Past trials (ineffective) include: sertraline, buspirone, VPA, citalopram, fluoxetine, vortioxetine, amitritpyline, nortriptyline, paroxeine, prazosin, lurasidone, lunesta, aripiprazole, quetiapine      OB: G8P3 now at [redacted] weeks gestation. Pregnancy was kind of planned. FOB is husband, Emily [name].  Pregnancy has been complicated by short cervix, h/o HGSIL, prior pregnancy with IUGR, asthma, recurrent UTIs, renal caliculus, and h/o hemorrhage postpartum. Planning to both breast and bottle feed baby. Having another boy, Emily [name].  ?  OB care [facility]    PMFSH Update:  Vapes a pod every two days  [name] EtOH or drugs     Lives in [place], [place] with husband Emily [name], three kids (4, 2, and 11 months)  Highest level of education achieved is 9th grade, working towards diploma online right now     Contraception: n/a currently pregnant    Review of Systems   Constitutional: Positive for appetite change. Negative for chills, fever and unexpected weight change.   Respiratory: Negative for shortness of breath.    Cardiovascular: Negative for chest pain.   Gastrointestinal: Negative for nausea and vomiting.   Psychiatric/Behavioral: Positive for decreased concentration, dysphoric mood and sleep disturbance. Negative for hallucinations, self-injury and suicidal ideas. The patient is nervous/anxious. The patient is not hyperactive.      Objective:         ? acetaminophen (TYLENOL) 500 mg tablet Take 1,000 mg by mouth every 6 hours as needed for Pain. Max of 4,000 mg of acetaminophen in 24 hours.   ? albuterol sulfate (PROAIR HFA) 90 mcg/actuation HFA aerosol inhaler Inhale two puffs by mouth into the lungs every 6 hours as needed for Wheezing or Shortness of Breath. Shake well before use.   ? cephalexin (KEFLEX) 500 mg  capsule Take one capsule by mouth at bedtime daily. Indications: infection prophylaxis, medical   ? cyclobenzaprine (FLEXERIL) 5 mg tablet Take two tablets by mouth at bedtime as needed for Muscle Cramps. Take at night for back pain   ? fluvoxaMINE (LUVOX) 25 mg tablet Take two tablets by mouth at bedtime daily for 7 days, THEN three tablets at bedtime daily for 14 days, THEN four tablets at bedtime daily for 10 days.   ? gabapentin (NEURONTIN) 100 mg capsule Take two capsules by mouth every 8 hours. Indications: neuropathic pain   ? metoclopramide HCL (REGLAN) 10 mg tablet Take one tablet by mouth every 6 hours as needed for Nausea or Vomiting.   ? promethazine (PHENERGAN) 25 mg tablet    ? pyridoxine (VITAMIN B-6) 25 mg tab Take 100 mg by mouth three times daily.   ? vitamins, prenatal w/iron & folate 65/1 mg tab Take 2 tablets by mouth daily.       There were [name] vitals filed for this visit.  telehealth   There is [name] height or weight on file to calculate BMI. telehealth    Physical Exam  Vitals reviewed.   Constitutional:       General: She is not in acute distress.     Appearance: She is not ill-appearing.   HENT:      Head: Normocephalic and atraumatic.   Eyes:      Conjunctiva/sclera: Conjunctivae normal.   Pulmonary:      Effort: Pulmonary effort is normal. [name] respiratory distress.   Skin:     Findings: [name] erythema or rash.   Neurological:      Mental Status: She is alert and oriented to person, place, and time.       Mental Status Evaluation:    General/Constitutional: Appears stated age, in personal attire, good hygiene and grooming.   Eye Contact: Fair  Behavior: Appropriate and cooperative  Speech: regular rate and rhythm, normal tone and volume.   Mood: anxious  Affect: dysthymic, mood congruent  Thought Process: Linear, organized, easy to follow and understand.  Thought Content: Denies SI/HI.   Perception: Denies AVH. [name] evidence of paranoia, delusions, or illusions.   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 She was taking cariprazine, Lunesta, and alprazolam prior to conception and stopped at [facility] (about 4.5 weeks). She is having worsening of anxiety and depression but is able to function and care for her three children at this time. Did not tolerate a mirtazapine trial in late February. We discussed other options today based on [facility] testing and safety data in pregnancy. Pt ultimately elected for a trial of fluvoxamine. Will consider venlafaxine moving forward if needed. Reviewed r/b/se in pregnancy and breastfeeding. [name] acute safety concerns.       Problem 1:  Depression affecting pregnancy; Stable  Problem 2:  CPTSD; Worse  Problem 3:  Anxiety affecting pregnancy, r/o OCD vs SAD; Worse    Plan:  ? Start fluvoxamine 50mg  po qhs x1 week then increase to 75mg  po qhs x2 weeks then increase to 100mg   po qhs   ? Consider venlafaxine if pt does not tolerate fluvoxamine   ? Continue individual therapy       Follow up:  Return to clinic in 4 weeks or sooner PRN    The proposed treatment plan was discussed with the patient/guardian who was provided the opportunity to ask questions and make suggestions regarding alternative treatment.     Safety plan discussed; [name] AVS via telehealth     Dimple Casey, MD

## 2020-05-28 ENCOUNTER — Encounter: Admit: 2020-05-28 | Discharge: 2020-05-28 | Payer: MEDICAID

## 2020-05-28 NOTE — Progress Notes
Patiet considered GDM after elevations on paneling reviewed by Dr. Nedra Hai. Information sent via mychart as patient is unable to make it to an in person appointment for counseling. Follow up scheduled in [place].     Future Appointments   Date Time Provider Department Center   06/04/2020  9:00 AM [facility] H[facility] RISK [facility]   06/18/2020 10:30 AM [facility] [place] ROOM 1 [id number]   06/18/2020 11:00 AM [facility] RIS[facility]

## 2020-05-30 ENCOUNTER — Encounter: Admit: 2020-05-30 | Discharge: 2020-05-30 | Payer: MEDICAID

## 2020-05-30 MED ORDER — HUMULIN N NPH INSULIN KWIKPEN 100 UNIT/ML (3 ML) SC INPN
3 refills | 38.00000 days | Status: AC
Start: 2020-05-30 — End: ?

## 2020-05-30 MED ORDER — PEN NEEDLE, DIABETIC 32 GAUGE X 5/32" MISC NDLE
1 | Freq: Two times a day (BID) | 3 refills | 30.00000 days | Status: AC
Start: 2020-05-30 — End: ?

## 2020-05-30 NOTE — Progress Notes
Patient emailed blood glucose log.  Reviewed by Dr. Nedra Hai.    Current regimen   diet  Recommended regimen   NPH 24 units AM/ 10 units dinner    Patient emailed and informed of changes.

## 2020-05-31 ENCOUNTER — Encounter: Admit: 2020-05-31 | Discharge: 2020-05-31 | Payer: MEDICAID

## 2020-05-31 NOTE — Telephone Encounter
Called patient to discuss insulin start. Her husband will be assisting with injecting. She feels comfortable with the outpatient start. No questions or concerns at this time.

## 2020-06-04 ENCOUNTER — Encounter: Admit: 2020-06-04 | Discharge: 2020-06-04 | Payer: MEDICAID

## 2020-06-04 DIAGNOSIS — O99343 Other mental disorders complicating pregnancy, third trimester: Secondary | ICD-10-CM

## 2020-06-04 DIAGNOSIS — Z98891 History of uterine scar from previous surgery: Secondary | ICD-10-CM

## 2020-06-04 DIAGNOSIS — O99891 Back pain affecting pregnancy in third trimester: Secondary | ICD-10-CM

## 2020-06-04 DIAGNOSIS — Z3A31 31 weeks gestation of pregnancy: Secondary | ICD-10-CM

## 2020-06-04 DIAGNOSIS — O24414 Gestational diabetes mellitus in pregnancy, insulin controlled: Secondary | ICD-10-CM

## 2020-06-04 MED ORDER — HYDROCODONE-ACETAMINOPHEN 5-325 MG PO TAB
1 | ORAL_TABLET | Freq: Every evening | ORAL | 0 refills | 30.00000 days | Status: AC | PRN
Start: 2020-06-04 — End: ?

## 2020-06-04 NOTE — Progress Notes
[facility]    Cc: lumbar back pain, history of HGSIL, history of CD and successful VBAC.      S: was started on NPH bid since last visit.  Reports no issues, but only started 4 days ago.  Husbands gives injection at 745a, and 21p.  She reports fasting glucose around 80-90s, post lunch 110-120s, pre dinner 100s, and post dinner 170-200s.      Reports that gabapentin is not working.  Offered elavil, but finds the onset of action too slow.  When informed that narcotics are best next step, she agrees.  Reports that in her last pregnancy, she used hydrocodone 5 mg/325 mg tablet.     We agreed that she will start with this dose for 7 days, then return in 1 wk and assess if she needs 10 mg instead.  We won't go higher than 10 mg per tablet. She intends to use only 1 pill at bedtime for sleep.     She also reports some headache, edema this past week.  Also reports a lot more pelvic pressure, asks for cervical exam check today.     Denies contractions, vaginal bleeding, LOF.      O:   Vitals:    06/04/20 0918   BP: 107/72   Pulse: (!) 121   Weight: 62.6 kg (138 lb)   Height: 149.9 cm (4' 11)   FHT 138 bpm    SVE closed, 3 cm, low, mid position.  Soft consistency.     Labs:  01/05/20 gc/chlam neg,   05/17/20 glucola 144, Hct 31.7, plt 274, ab screen neg, Treponema ab neg    Sono:    04/23/20 [redacted]w[redacted]d, EFW 774g (46%-ile) with AC at the 67%-ile.  Normal AFV and Doppler indices.    05/21/20 [redacted]w[redacted]d, EFW 47%, AC 46%, AFI 15 cm    A 24 y  [id number] at [redacted]w[redacted]d    Suspected herniated disc, back pain  Prior CS/prior VBAC  Prior FGR    PLAN:  GDM  - increase NPH to 26 am, 12 DINNER.    - weekly BPP start next week  - offered delivery at 38-39 weeks. She will start IOL [redacted]w[redacted]d on 05/23/20.  Can start the IOL at midnight.     Back pain affecting pregnancy  - worsened symptoms seem related to pregnancy related spine changes.    Sxs suspicious for herniated disk  - MRI lumbar without contrast scheduled to occur on 05/07/20  - neurontin 200 mg q8 PRN pain  - can use flexeril 10mg  HS PRN pain.   - 06/04/20 started hydrocodone 5/325 mg, 1 tab qhs, dispense 9 pills. Reassess in 1 wk      High grade squamous intraepithelial lesion (HGSIL) on cytologic smear of cervix (Reviewed history on 04/23/20)  - LGSIL with HR HPV positive status 03/23/19 (HPV 16, 18 and 45 negative).  Was supposed to have colpo at [facility] in May 2021 but never had this set up.    - ASCUS pap 09/12/2019  - Will require another pap with HRHPV screening July 2022.    ?  Acute cystitis with hematuria  - E. Faecalis UTI on 02/01/20, s/p treatment with Keflex  - Also associated with kidney stone.   - After antibiotics and passing large stone at home, her symptoms resolved  - Continue ppx Keflex   - UCx TOC  03/28/20 neg  ?  History of cesarean section  - G1 was SVD at term  - G2 was  38 week pre-labor CS due to FGR with non-reassuring fetal surveillance  - G3 was successful VBAC  - Counseled, would like TOLAC if there are no contraindications   ?  History of prior pregnancy with IUGR newborn  - Her second pregnancy/delivery was FGR fetus that required delivery at 38 weeks  - Will have growth q 4 weeks after 20 weeks  ?  Calculus of kidney affecting pregnancy, antepartum  - passed a large stone at home (02/12/20)  - UA WNL 05/21/20   - Reports many UTI and kidney stones in the past.  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 Also reports that she required a stent in one of her pregnancies because she couldn't empty her bladder.   - Discussed ppx antibiotic q HS to prevent UTI and pyelonephritis during pregnancy.  - No obvious anomalies during previous workups.   - No current issues  ?  ?  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 Clarisa Fling on 01/29/20  - Visit with Dr. Bertram Millard 05/21/20 - on fluvoaxamine (discontinued mirtazapine).   ?  Short cervix affecting pregnancy  ? Patient had CL on 04/09/20 of 2.3-2.9 cm, no funnel  ? No history of PTB  ? Preterm labor precautions discussed.    ?  ?High-risk pregnancy in second trimester  - PNL reviewed and WNL, Rh +, RI  - Need to request pap smear   - NT WNL with negative NIPS  - DUS completed 03/28/20 wnl  - Declined flu and COVID.  Extensively counseled on risks of COVID during pregnancy at previous visits.    - Declined Tdap 05/17/20  - ordered HIV, gc/chlam 05/17/20 (again requested completion 06/04/20)  - BCM - postplacental Paragard.  - Delivery: scheduled 05/23/20 2000p.     Next visit 1 weeks.  BPP weekly now, Next growth scan 2 weeks.          A total of 30 minutes was spent preparing to see the patient on this date, obtaining and/or reviewing separately obtained history, performing a medically appropriate examination and/or evaluation, counseling and educating the patient/family/caregiver, documenting clinical information in the electronic or other health record and/or interpreting fetal ultrasound findings that pertained to [facility] care.

## 2020-06-04 NOTE — Progress Notes
Patient verbalized HA, facial swelling over the weekend, hands and feet swelling, pelvic/thigh  Pressure. Dr. Nedra Hai notified

## 2020-06-06 ENCOUNTER — Encounter: Admit: 2020-06-06 | Discharge: 2020-06-06 | Payer: MEDICAID

## 2020-06-07 ENCOUNTER — Encounter: Admit: 2020-06-07 | Discharge: 2020-06-07 | Payer: MEDICAID

## 2020-06-07 DIAGNOSIS — Z3A31 31 weeks gestation of pregnancy: Secondary | ICD-10-CM

## 2020-06-07 DIAGNOSIS — O0993 Supervision of high risk pregnancy, unspecified, third trimester: Secondary | ICD-10-CM

## 2020-06-07 DIAGNOSIS — O24414 Gestational diabetes mellitus in pregnancy, insulin controlled: Secondary | ICD-10-CM

## 2020-06-07 NOTE — Progress Notes
Pt calls and c/o increased nausea and ha that does not go away with tylenol.  Pt is currently on several meds for nausea and states they arent working.  Discussed with Dr. Daphine Deutscher and instructed pt to go to L&D to be evaluated.  Pt verbalized correct understanding.

## 2020-06-11 ENCOUNTER — Encounter: Admit: 2020-06-11 | Discharge: 2020-06-11 | Payer: MEDICAID

## 2020-06-11 DIAGNOSIS — O0993 Supervision of high risk pregnancy, unspecified, third trimester: Secondary | ICD-10-CM

## 2020-06-11 DIAGNOSIS — O24414 Gestational diabetes mellitus in pregnancy, insulin controlled: Secondary | ICD-10-CM

## 2020-06-11 DIAGNOSIS — O99343 Other mental disorders complicating pregnancy, third trimester: Secondary | ICD-10-CM

## 2020-06-11 DIAGNOSIS — Z98891 History of uterine scar from previous surgery: Secondary | ICD-10-CM

## 2020-06-11 DIAGNOSIS — O26839 Pregnancy related renal disease, unspecified trimester: Secondary | ICD-10-CM

## 2020-06-11 DIAGNOSIS — Z8759 Personal history of other complications of pregnancy, childbirth and the puerperium: Secondary | ICD-10-CM

## 2020-06-11 DIAGNOSIS — O99891 Back pain affecting pregnancy in third trimester: Secondary | ICD-10-CM

## 2020-06-11 DIAGNOSIS — R87613 High grade squamous intraepithelial lesion on cytologic smear of cervix (HGSIL): Secondary | ICD-10-CM

## 2020-06-11 DIAGNOSIS — Z3A32 32 weeks gestation of pregnancy: Secondary | ICD-10-CM

## 2020-06-11 NOTE — Telephone Encounter
[facility] nurse contacted Radiology scheduling transferred to manager to reschedule MRI for patient since apt needed asap and patient lives far from [facility].  LVM with Manager and requested patient be contact to schedule MRI within next week or two.  PT referral placed. Patient messaged and provided contact numbered for PT scheduling. Encouraged to call PT and see if location that would work best for her. In addition, instructed to contact [facility] nurse back if she would be unable to make it to any of [place] PT sites so we could discuss further with MFM.

## 2020-06-11 NOTE — Progress Notes
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  Logs reviewed today, see below. Discussed importance of sending every week    The patient is currently rx'd hydrocodone/acetaminophen by Dr. Nedra Hai, she is currently using 1 pill a day. Patient instructed to send a message the day before she runs out.       Denies contractions, vaginal bleeding, LOF.  Denies any HA, RUQ pain or changes in vision.     Glucose review    Fastings: 83-149  (4/6 abnormal)  2 hour post-breakfast: 95-221  (1/5 abnormal)  2 hour post-lunch:  112-151   (2/5 abnormal)  2 hour post-dinner:  99-123  (1/5 abnormal)      O:   Vitals:    06/11/20 1051   BP: 103/68   Pulse: 106   Weight: 62 kg (136 lb 9.6 oz)   Height: 149.9 cm (4' 11)        General: NAD  Abd: soft, gravid, nt    Labs:  01/05/20 gc/chlam neg,   05/17/20 glucola 144, Hct 31.7, plt 274, ab screen neg, Treponema ab neg    Sono:    04/23/20 [redacted]w[redacted]d, EFW 774g (46%-ile) with AC at the 67%-ile.  Normal AFV and Doppler indices.    05/21/20 [redacted]w[redacted]d, EFW 47%, AC 46%, AFI 15 cm    A 24 y  [id number] at [redacted]w[redacted]d     Suspected herniated disc, back pain  Prior CS/prior VBAC  Prior FGR    PLAN:  GDM  - increase NPH to 30 am, 16 bedtime.    - weekly BPP start next week  - offered delivery at 38-39 weeks. She will start IOL [redacted]w[redacted]d on 05/23/20.  Can start the IOL at midnight.   - Needs 2 hour GTT after delivery    Back pain affecting pregnancy  - worsened symptoms seem related to pregnancy related spine changes.    Sxs suspicious for herniated disk  - MRI lumbar without contrast scheduled to occur on 05/07/20-has not gotten  - neurontin 200 mg q8 PRN pain  - can use flexeril 10mg  HS PRN pain.   - 06/04/20 started hydrocodone 5/325 mg, 1 tab qhs, dispense 9 pills. Reassess in 1 wk  - 06/11/20 checked KTRACS-no additional rx's seen, discussed importance of imaging, referral to physical therapy made      High grade squamous intraepithelial lesion (HGSIL) on cytologic smear of cervix (Reviewed history on 04/23/20)  - LGSIL with HR HPV positive status 03/23/19 (HPV 16, 18 and 45 negative).  Was supposed to have colpo at [facility] in May 2021 but never had this set up.    - ASCUS pap 09/12/2019  - Will require another pap with HRHPV screening July 2022.    ?  Acute cystitis with hematuria  - E. Faecalis UTI on 02/01/20, s/p treatment with Keflex  - Also associated with kidney stone.   - After antibiotics and passing large stone at home, her symptoms resolved  - Continue ppx Keflex   - UCx TOC  03/28/20 neg  ?  History of cesarean section  - G1 was SVD at term  - G2 was 38 week pre-labor CS due to FGR with non-reassuring fetal surveillance  - G3 was successful VBAC  - Counseled, would like TOLAC if there are no contraindications   ?  History of prior pregnancy with IUGR newborn  - Her  second pregnancy/delivery was FGR fetus that required delivery at 38 weeks  - Will have growth q 4 weeks after 20 weeks  ?  Calculus of kidney affecting pregnancy, antepartum  - passed a large stone at home (02/12/20)  - UA WNL 05/21/20   - Reports many UTI and kidney stones in the past.  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 Also reports that she required a stent in one of her pregnancies because she couldn't empty her bladder.   - Discussed ppx antibiotic q HS to prevent UTI and pyelonephritis during pregnancy.  - No obvious anomalies during previous workups.   - No current issues  ?  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 Clarisa Fling on 01/29/20  - Visit with Dr. Bertram Millard 05/21/20 - on fluvoaxamine (discontinued mirtazapine).   ?  Short cervix affecting pregnancy  ? Patient had CL on 04/09/20 of 2.3-2.9 cm, no funnel  ? No history of PTB  ? Preterm labor precautions discussed.    ?  ?High-risk pregnancy in second trimester  - PNL reviewed and WNL, Rh +, RI  - Need to request pap smear - See above  - NT WNL with negative NIPS  - DUS completed 03/28/20 wnl  - Declined flu and COVID.  Extensively counseled on risks of COVID during pregnancy at previous visits.    - Declined Tdap 05/17/20  - ordered HIV, gc/chlam 05/17/20 (again requested completion 06/11/20)  - GBS at 35 weeks  - BCM - postplacental Paragard.  - Delivery: scheduled 07/22/20 2000p.     Patient has not gotten MRI yet, discussed this is important to the work up of her back pain  Physical therapy referral made - trying to find the location closest to her home  HIV and GC/CT needs to be completed-discussed today  GBS at 35 weeks   Pre-eclampsia s/sx discussed  Preterm labor instructions discussed  Kick counts discussed         Emily Shoulder, MD     Total Time Today was 35 minutes in the following activities: Preparing to see the patient, Obtaining and/or reviewing separately obtained history, Performing a medically appropriate examination and/or evaluation, Counseling and educating the patient/family/caregiver, Ordering medications, tests, or procedures and Documenting clinical information in the electronic or other health record

## 2020-06-12 ENCOUNTER — Encounter: Admit: 2020-06-12 | Discharge: 2020-06-12 | Payer: MEDICAID

## 2020-06-12 DIAGNOSIS — O24414 Gestational diabetes mellitus in pregnancy, insulin controlled: Secondary | ICD-10-CM

## 2020-06-12 DIAGNOSIS — O99891 Back pain affecting pregnancy in third trimester: Secondary | ICD-10-CM

## 2020-06-12 DIAGNOSIS — O0993 Supervision of high risk pregnancy, unspecified, third trimester: Secondary | ICD-10-CM

## 2020-06-12 DIAGNOSIS — Z3A32 32 weeks gestation of pregnancy: Secondary | ICD-10-CM

## 2020-06-12 MED ORDER — HYDROCODONE-ACETAMINOPHEN 5-325 MG PO TAB
1 | ORAL_TABLET | Freq: Every evening | ORAL | 0 refills | 30.00000 days | Status: AC | PRN
Start: 2020-06-12 — End: ?

## 2020-06-12 NOTE — Progress Notes
Message received that Emily [name] is almost out of 5/325 norco pills.  Last week prescribed 9 pills.  She did not request stronger dose of 10 mg.  Will prescribe 30 pills.  Next refill should be prescribed by myself in 4 weeks.      Algernon Huxley, MD

## 2020-06-17 ENCOUNTER — Encounter: Admit: 2020-06-17 | Discharge: 2020-06-17 | Payer: MEDICAID

## 2020-06-18 ENCOUNTER — Encounter: Admit: 2020-06-18 | Discharge: 2020-06-18 | Payer: MEDICAID

## 2020-06-18 DIAGNOSIS — O24414 Gestational diabetes mellitus in pregnancy, insulin controlled: Secondary | ICD-10-CM

## 2020-06-18 NOTE — Progress Notes
Glucose log reviewed    Fasting 56-95  Breakfast 94-115  Lunch 95-115  Dinner 84-120    PLAN:  Stay at NPH 30/16 dinner.   Next MD visit in 1 wk  Continue weekly BPP.    Algernon Huxley, MD

## 2020-06-21 ENCOUNTER — Encounter: Admit: 2020-06-21 | Discharge: 2020-06-21 | Payer: MEDICAID

## 2020-06-21 MED ORDER — FLUVOXAMINE 25 MG PO TAB
ORAL_TABLET | Freq: Every evening | ORAL | 0 refills
Start: 2020-06-21 — End: ?

## 2020-06-21 MED ORDER — FLUVOXAMINE 100 MG PO TAB
100 mg | ORAL_TABLET | Freq: Every evening | ORAL | 1 refills | Status: AC
Start: 2020-06-21 — End: ?
  Filled 2020-08-22: qty 30, 30d supply, fill #1

## 2020-06-25 ENCOUNTER — Encounter: Admit: 2020-06-25 | Discharge: 2020-06-25 | Payer: MEDICAID

## 2020-06-25 DIAGNOSIS — O26839 Pregnancy related renal disease, unspecified trimester: Secondary | ICD-10-CM

## 2020-06-25 DIAGNOSIS — O0993 Supervision of high risk pregnancy, unspecified, third trimester: Secondary | ICD-10-CM

## 2020-06-25 DIAGNOSIS — Z98891 History of uterine scar from previous surgery: Secondary | ICD-10-CM

## 2020-06-25 DIAGNOSIS — O99891 Back pain affecting pregnancy in third trimester: Secondary | ICD-10-CM

## 2020-06-25 DIAGNOSIS — O24414 Gestational diabetes mellitus in pregnancy, insulin controlled: Secondary | ICD-10-CM

## 2020-06-25 NOTE — Progress Notes
Patient states she took two Hydrocodone one day due to pain. Dr. Noelle Penner is aware

## 2020-06-25 NOTE — Progress Notes
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  Logs reviewed today, see below. Discussed importance of sending every week    The patient is currently rx'd hydrocodone/acetaminophen by Dr. Nedra Hai, she is currently using 1 pill a day. Patient instructed to send a message the day before she runs out.       Denies contractions, vaginal bleeding, LOF.  Denies any HA, RUQ pain or changes in vision.     Glucose review  All are in target ranges    O:   Vitals:    06/25/20 1144   BP: 97/62   Pulse: 117   Weight: 61.7 kg (136 lb)   Height: 149.9 cm (4' 11)        General: NAD  Abd: soft, gravid, nt    Labs:  01/05/20 gc/chlam neg,   05/17/20 glucola 144, Hct 31.7, plt 274, ab screen neg, Treponema ab neg    Sono:    04/23/20 [redacted]w[redacted]d, EFW 774g (46%-ile) with AC at the 67%-ile.  Normal AFV and Doppler indices.    05/21/20 [redacted]w[redacted]d, EFW 47%, AC 46%, AFI 15 cm  06/24/20    A 24 y  [id number] at [redacted]w[redacted]d      Suspected herniated disc, back pain  Prior CS/prior VBAC  Prior FGR    PLAN:  GDM  - continue NPH to 30 am, 16 bedtime.    - continue BPP start next week  - delivery at 38-39 weeks. She will start IOL [redacted]w[redacted]d on 07/23/20.  Can start the IOL at midnight.   - Needs 2 hour GTT after delivery    Back pain affecting pregnancy  - worsened symptoms seem related to pregnancy related spine changes.    Sxs suspicious for herniated disk  - MRI lumbar without contrast scheduled to occur on 05/07/20-has not gotten  - neurontin 200 mg q8 PRN pain  - can use flexeril 10mg  HS PRN pain.   - 06/04/20 started hydrocodone 5/325 mg, 1 tab qhs, dispense 9 pills. Reassess in 1 wk  - 06/11/20 checked KTRACS-no additional rx's seen, discussed importance of imaging, referral to physical therapy made      High grade squamous intraepithelial lesion (HGSIL) on cytologic smear of cervix (Reviewed history on 04/23/20)  - LGSIL with HR HPV positive status 03/23/19 (HPV 16, 18 and 45 negative).  Was supposed to have colpo at [facility] in May 2021 but never had this set up.    - ASCUS pap 09/12/2019  - Will require another pap with HRHPV screening July 2022.    ?  Acute cystitis with hematuria  - E. Faecalis UTI on 02/01/20, s/p treatment with Keflex  - Also associated with kidney stone.   - After antibiotics and passing large stone at home, her symptoms resolved  - Continue ppx Keflex   - UCx TOC  03/28/20 neg  ?  History of cesarean section  - G1 was SVD at term  - G2 was 38 week pre-labor CS due to FGR with non-reassuring fetal surveillance  - G3 was successful VBAC  - Counseled, would like TOLAC if there are no contraindications   ?  History of prior pregnancy with IUGR newborn  - Her second pregnancy/delivery was FGR fetus that required delivery at 38 weeks  - Will have growth q 4 weeks after 20 weeks  ?  Calculus of kidney affecting pregnancy, antepartum  -  passed a large stone at home (02/12/20)  - UA WNL 05/21/20   - Reports many UTI and kidney stones in the past.  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 Also reports that she required a stent in one of her pregnancies because she couldn't empty her bladder.   - Discussed ppx antibiotic q HS to prevent UTI and pyelonephritis during pregnancy.  - No obvious anomalies during previous workups.   - No current issues  ?  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 Clarisa Fling on 01/29/20  - Visit with Dr. Bertram Millard 05/21/20 - on fluvoaxamine (discontinued mirtazapine).   ?  Short cervix affecting pregnancy  ? Patient had CL on 04/09/20 of 2.3-2.9 cm, no funnel  ? No history of PTB  ? Preterm labor precautions discussed.    ?  ?High-risk pregnancy in second trimester  - PNL reviewed and WNL, Rh +, RI  - Need to request pap smear - See above  - NT WNL with negative NIPS  - DUS completed 03/28/20 wnl  - Declined flu and COVID.  Extensively counseled on risks of COVID during pregnancy at previous visits.    - Declined Tdap 05/17/20  - ordered HIV, gc/chlam 05/17/20 (again requested completion 06/25/20)  - GBS at 35 weeks  - BCM - postplacental Paragard.  - Delivery: scheduled 07/22/20 2000p.     Patient has not gotten MRI yet, may need to do PP  Physical therapy referral made - trying to find the location closest to her home  GBS at 35 weeks   Pre-eclampsia s/sx discussed  Preterm labor instructions discussed        Kary Kos, MD     Total Time Today was 30 minutes in the following activities: Preparing to see the patient, Obtaining and/or reviewing separately obtained history, Performing a medically appropriate examination and/or evaluation, Counseling and educating the patient/family/caregiver, Ordering medications, tests, or procedures and Documenting clinical information in the electronic or other health record

## 2020-06-28 ENCOUNTER — Encounter: Admit: 2020-06-28 | Discharge: 2020-06-28 | Payer: MEDICAID

## 2020-06-28 MED ORDER — HYDROCODONE-ACETAMINOPHEN 5-325 MG PO TAB
1 | ORAL_TABLET | Freq: Two times a day (BID) | ORAL | 0 refills | 15.00000 days | Status: AC | PRN
Start: 2020-06-28 — End: ?

## 2020-07-02 ENCOUNTER — Encounter: Admit: 2020-07-02 | Discharge: 2020-07-02 | Payer: MEDICAID

## 2020-07-02 ENCOUNTER — Encounter: Admit: 2020-07-02 | Discharge: 2020-07-03 | Payer: MEDICAID

## 2020-07-02 DIAGNOSIS — R8290 Unspecified abnormal findings in urine: Secondary | ICD-10-CM

## 2020-07-02 DIAGNOSIS — O26899 Other specified pregnancy related conditions, unspecified trimester: Secondary | ICD-10-CM

## 2020-07-02 DIAGNOSIS — Z8759 Personal history of other complications of pregnancy, childbirth and the puerperium: Secondary | ICD-10-CM

## 2020-07-02 DIAGNOSIS — O24414 Gestational diabetes mellitus in pregnancy, insulin controlled: Secondary | ICD-10-CM

## 2020-07-02 DIAGNOSIS — Z98891 History of uterine scar from previous surgery: Secondary | ICD-10-CM

## 2020-07-02 DIAGNOSIS — Z3685 Encounter for antenatal screening for Streptococcus B: Secondary | ICD-10-CM

## 2020-07-02 MED ORDER — CEPHALEXIN 500 MG PO CAP
500 mg | ORAL_CAPSULE | Freq: Every evening | ORAL | 7 refills | Status: AC
Start: 2020-07-02 — End: ?

## 2020-07-02 NOTE — Progress Notes
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  Denies LOF.  Good FM reported.  Denies any HA, RUQ pain or changes in vision.     Denies abnormal vaginal discharge.  Denies hematuria or dysuria.  Did stop taking the Keflex HS x 5 days due to the fact that she was taking another antibiotic for a recent diagnosis of sinusitis.  She reports that she plans to restart the Keflex today.      The patient is currently rx'd hydrocodone/acetaminophen for her back pain.  Using 2 pills per day. Patient instructed to send a message the day before she runs out.       Glucose review  All are in target ranges with the exception of post dinner value on Mother's day.      O:   Vitals:    07/02/20 0929   BP: 111/70   Pulse: 93   Weight: 62.3 kg (137 lb 6.4 oz)   Height: 149.9 cm (4' 11)       UA moderate blood; +leuks and protein  General: NAD  Abd: soft, gravid, nt  SVE:  EGBUS WNL  Cervix:      Labs:  01/05/20 gc/chlam neg,   05/17/20 glucola 144, Hct 31.7, plt 274, ab screen neg, Treponema ab neg    Sono:      07/02/20:  [redacted]W[redacted]D    A 24 y  [id number] at [redacted]w[redacted]d      Suspected herniated disc, back pain  Prior CS/prior VBAC  Prior FGR    PLAN:  A2GDM  - continue NPH to 30 am, 16 bedtime.    - continue BPP start next week  - delivery at 38-39 weeks. She will start IOL [redacted]w[redacted]d on 07/23/20.  Can start the IOL at midnight.   - Needs 2 hour GTT after delivery    Back pain affecting pregnancy  - worsened symptoms seem related to pregnancy related spine changes.    Sxs suspicious for herniated disk  - MRI lumbar without contrast scheduled to occur on 05/07/20-has not gotten  - neurontin 200 mg q8 PRN pain  - can use flexeril 10mg  HS PRN pain.   - 06/04/20 started hydrocodone 5/325 mg, 1 tab qhs, dispense 9 pills. Reassess in 1 wk  - 06/11/20 checked KTRACS-no additional rx's seen, discussed importance of imaging, referral to physical therapy made  - 06/28/20:  Refilled #60      High grade squamous intraepithelial lesion (HGSIL) on cytologic smear of cervix (Reviewed history on 04/23/20)  - LGSIL with HR HPV positive status 03/23/19 (HPV 16, 18 and 45 negative).  Was supposed to have colpo at [facility] in May 2021 but never had this set up.    - ASCUS pap 09/12/2019  - Will require another pap with HRHPV screening July 2022.    ?  History of acute cystitis with hematuria  - E. Faecalis UTI on 02/01/20, s/p treatment with Keflex  - Also associated with kidney stone.   - After antibiotics and passing large stone at home, her symptoms resolved  - Continue ppx Keflex   - UCx TOC  03/28/20 neg  - UA Today: suspicious for UTI.  Lab requisition given for urine culture.     ?  History of cesarean section  - G1 was SVD at term  - G2 was 38 week pre-labor CS due  to FGR with non-reassuring fetal surveillance  - G3 was successful VBAC  - Counseled, would like TOLAC if there are no contraindications   ?  History of prior pregnancy with IUGR newborn  - Her second pregnancy/delivery was FGR fetus that required delivery at 38 weeks  - Last Growth 2087g (39%-ile) on 06/18/20.  Will repeat in 2 weeks.    ?  Calculus of kidney affecting pregnancy, antepartum  - passed a large stone at home (02/12/20)  - UA WNL 05/21/20   - Reports many UTI and kidney stones in the past.  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 Also reports that she required a stent in one of her pregnancies because she couldn't empty her bladder.   - Discussed ppx antibiotic q HS to prevent UTI and pyelonephritis during pregnancy.  - No obvious anomalies during previous workups.     ?  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 Clarisa Fling on 01/29/20  - Visit with Dr. Bertram Millard 05/21/20 - on fluvoaxamine (discontinued mirtazapine).   ?  Short cervix affecting pregnancy  ? Patient had CL on 04/09/20 of 2.3-2.9 cm, no funnel  ? No history of PTB  ? Preterm labor precautions discussed.    ?  ?High-risk pregnancy in second trimester  - PNL reviewed and WNL, Rh +, RI  - Will require another pap with HRHPV screening July 2022.  - NT WNL with negative NIPS  - DUS completed 03/28/20 wnl  - Declined flu and COVID.  Extensively counseled on risks of COVID during pregnancy at previous visits.    - Declined Tdap 05/17/20  - ordered HIV, gc/chlam ([facility] did these in [place], [place]).  We have requested these records.    - GBS at 35 weeks (performed today)  - BCM - postplacental Paragard.  - Delivery: scheduled 07/22/20 2000p.     Patient has not gotten MRI yet, may need to do PP  Physical therapy referral made.    GBS at 35 weeks(performed today)   Pre-eclampsia s/sx discussed  Preterm labor instructions discussed  Urine Culture requisition given for Julaine to take to [facility].          Rayann Heman, DO     Total Time Today was 40 minutes in the following activities: Preparing to see the patient, Obtaining and/or reviewing separately obtained history, Performing a medically appropriate examination and/or evaluation, Counseling and educating the patient/family/caregiver, Ordering medications, tests, or procedures and Documenting clinical information in the electronic or other health record

## 2020-07-03 DIAGNOSIS — Z3685 Encounter for antenatal screening for Streptococcus B: Secondary | ICD-10-CM

## 2020-07-04 ENCOUNTER — Encounter: Admit: 2020-07-04 | Discharge: 2020-07-04 | Payer: MEDICAID

## 2020-07-04 DIAGNOSIS — O24414 Gestational diabetes mellitus in pregnancy, insulin controlled: Secondary | ICD-10-CM

## 2020-07-04 DIAGNOSIS — O0993 Supervision of high risk pregnancy, unspecified, third trimester: Secondary | ICD-10-CM

## 2020-07-04 DIAGNOSIS — O99891 Back pain affecting pregnancy in third trimester: Secondary | ICD-10-CM

## 2020-07-04 DIAGNOSIS — Z3A32 32 weeks gestation of pregnancy: Secondary | ICD-10-CM

## 2020-07-04 NOTE — Progress Notes
HIV results 06/26/20 -  Negative - [facility]   GC/CT results 06/26/20 [facility] - Negative for GC/CT   Will place in file to be scanned. Patient notified via my chart.   Awaiting response from patient if UCX ordered this week has been completed

## 2020-07-05 ENCOUNTER — Encounter: Admit: 2020-07-05 | Discharge: 2020-07-05 | Payer: MEDICAID

## 2020-07-05 NOTE — Telephone Encounter
Patient called in to move Tuesday appointment to later in the day. No viability. Patient unwilling or unable to drive to another location that day or Monday. Emphasized the importance of her BPP appointment. Patient will try to make it on Tuesday to [facility], if she cannot will contact us.

## 2020-07-09 ENCOUNTER — Encounter: Admit: 2020-07-09 | Discharge: 2020-07-10 | Payer: MEDICAID

## 2020-07-09 ENCOUNTER — Encounter: Admit: 2020-07-09 | Discharge: 2020-07-09 | Payer: MEDICAID

## 2020-07-09 DIAGNOSIS — O26893 Other specified pregnancy related conditions, third trimester: Secondary | ICD-10-CM

## 2020-07-09 DIAGNOSIS — Z98891 History of uterine scar from previous surgery: Secondary | ICD-10-CM

## 2020-07-09 DIAGNOSIS — R8290 Unspecified abnormal findings in urine: Secondary | ICD-10-CM

## 2020-07-09 DIAGNOSIS — O99343 Other mental disorders complicating pregnancy, third trimester: Secondary | ICD-10-CM

## 2020-07-09 DIAGNOSIS — O99891 Back pain affecting pregnancy in third trimester: Secondary | ICD-10-CM

## 2020-07-09 DIAGNOSIS — O24414 Gestational diabetes mellitus in pregnancy, insulin controlled: Secondary | ICD-10-CM

## 2020-07-09 NOTE — Progress Notes
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  Also some vag discharge. No odor, no itching. Did not complete urine culture, but no new symptoms.      neg vag bleeding, neg ctx,  Denies LOF.  Good FM reported.  Denies any HA, RUQ pain or changes in vision.       The patient is currently rx'd hydrocodone/acetaminophen for her back pain.  Using 2 pills per day. Patient instructed to send a message the day before she runs out.       Glucose review   did not bring, will email later today    O:   Vitals:    07/09/20 1129   BP: 106/74   Pulse: 109   Weight: 61.5 kg (135 lb 9.6 oz)   Height: 149.9 cm (4' 11)     UA 1+ protein, trace leukocytes, neg nitrites, neg blood.       Cervix:  1 cm, 75% effaced, +2 station. Posterior position, soft.    SSE: ext genitalia nl, cervix without ulceration, nl physiologic discharge, vaginal wall normal. Cotton swab placed in tube with NS for wet mount test.     Labs:  01/05/20 gc/chlam neg,   05/17/20 glucola 144, Hct 31.7, plt 274, ab screen neg, Treponema ab neg  06/26/20 HIV neg, gc neg/ chlam neg  07/02/20 gbs neg      Sono:    06/18/20 [redacted]w[redacted]d, EFW 39%, AC 56%, AFI 14 cm  07/09/20 BPP 8/8, AFI 8 cm    A 24 y  [id number] at [redacted]w[redacted]d by 7 wk CRL  GDM  Suspected herniated disc, back pain  Prior CS/prior VBAC  Prior FGR    PLAN:  A2GDM  - continue NPH to 30 am, 16 bedtime.    - continue BPP weekly  - delivery at 38-39 weeks. She will start IOL [redacted]w[redacted]d on 07/23/20.  Can start the IOL at midnight.   - Needs 2 hour GTT after delivery    Back pain affecting pregnancy  - worsened symptoms seem related to pregnancy related spine changes.    Sxs suspicious for herniated disk  - MRI lumbar without contrast scheduled to occur on 05/07/20-has not gotten  - neurontin 200 mg q8 PRN pain  - can use flexeril 10mg  HS PRN pain.   - 06/04/20 started hydrocodone 5/325 mg, 1 tab qhs, dispense 9 pills. Reassess in 1 wk  - 06/11/20 checked KTRACS-no additional rx's seen, discussed importance of imaging, referral to physical therapy made  - 06/28/20:  Refilled #60      High grade squamous intraepithelial lesion (HGSIL) on cytologic smear of cervix (Reviewed history on 04/23/20)  - LGSIL with HR HPV positive status 03/23/19 (HPV 16, 18 and 45 negative).  Was supposed to have colpo at [facility] in May 2021 but never had this set up.    - ASCUS pap 09/12/2019  - Will require another pap with HRHPV screening July 2022.    ?  History of acute cystitis with hematuria  - E. Faecalis UTI on 02/01/20, s/p treatment with Keflex  - Also associated with kidney stone.   - After antibiotics and passing large stone at home, her symptoms resolved  - Continue ppx Keflex   - UCx TOC  03/28/20 neg  - UA 07/02/20: suspicious for UTI.  Lab requisition given but not completed     ?  History of cesarean section  - G1 was SVD at term  - G2 was 38 week pre-labor CS due to FGR with non-reassuring fetal surveillance  - G3 was successful VBAC  - Counseled, would like TOLAC if there are no contraindications   ?  History of prior pregnancy with IUGR newborn  - Her second pregnancy/delivery was FGR fetus that required delivery at 38 weeks  - Last Growth 2087g (39%-ile) on 06/18/20.  Will repeat in 2 weeks.    ?  Calculus of kidney affecting pregnancy, antepartum  - passed a large stone at home (02/12/20)  - UA WNL 05/21/20   - Reports many UTI and kidney stones in the past.  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 Also reports that she required a stent in one of her pregnancies because she couldn't empty her bladder.   - Discussed ppx antibiotic q HS to prevent UTI and pyelonephritis during pregnancy.  - No obvious anomalies during previous workups.     ?  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 Clarisa Fling on 01/29/20  - Visit with Dr. Bertram Millard 05/21/20 - on fluvoaxamine (discontinued mirtazapine).   ?  Short cervix affecting pregnancy  ? Patient had CL on 04/09/20 of 2.3-2.9 cm, no funnel  ? No history of PTB  ? Preterm labor precautions discussed.    ?  ?High-risk pregnancy in second trimester  - PNL reviewed and WNL, Rh +, RI  - Will require another pap with HRHPV screening July 2022.  - NT WNL with negative NIPS  - DUS completed 03/28/20 wnl  - Declined flu and COVID.  Extensively counseled on risks of COVID during pregnancy at previous visits.    - Declined Tdap 05/17/20  - GBS 07/02/20 neg  - BCM - postplacental Paragard.  - Delivery: scheduled 07/22/20 2000p.     Patient has not gotten MRI yet, may need to do PP  Physical therapy referral made.    Pre-eclampsia s/sx discussed  Preterm labor instructions discussed  Urine Culture requisition printed again 07/09/20. Can take to [facility].      RTC 1 wk. BPP weekly.       Algernon Huxley, MD     Total Time Today was 40 minutes in the following activities: Preparing to see the patient, Obtaining and/or reviewing separately obtained history, Performing a medically appropriate examination and/or evaluation, Counseling and educating the patient/family/caregiver, Ordering medications, tests, or procedures and Documenting clinical information in the electronic or other health record

## 2020-07-10 DIAGNOSIS — R8290 Unspecified abnormal findings in urine: Secondary | ICD-10-CM

## 2020-07-15 ENCOUNTER — Encounter: Admit: 2020-07-15 | Discharge: 2020-07-15 | Payer: MEDICAID

## 2020-07-16 ENCOUNTER — Encounter: Admit: 2020-07-16 | Discharge: 2020-07-16 | Payer: MEDICAID

## 2020-07-16 DIAGNOSIS — O24414 Gestational diabetes mellitus in pregnancy, insulin controlled: Secondary | ICD-10-CM

## 2020-07-16 DIAGNOSIS — Z3685 Encounter for antenatal screening for Streptococcus B: Secondary | ICD-10-CM

## 2020-07-16 DIAGNOSIS — O99343 Other mental disorders complicating pregnancy, third trimester: Secondary | ICD-10-CM

## 2020-07-16 DIAGNOSIS — O99891 Back pain affecting pregnancy in third trimester: Secondary | ICD-10-CM

## 2020-07-16 DIAGNOSIS — Z98891 History of uterine scar from previous surgery: Secondary | ICD-10-CM

## 2020-07-16 NOTE — Progress Notes
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  Also some vag discharge. No odor, no itching. Did not complete urine culture, but no new symptoms.      neg vag bleeding, neg ctx,  Denies LOF.  Good FM reported.  Denies any HA, RUQ pain or changes in vision.       The patient is currently rx'd hydrocodone/acetaminophen for her back pain.  Using 2 pills per day. Patient instructed to send a message the day before she runs out.       Glucose review   did not bring, will email later today    O:   Vitals:    07/16/20 1040   BP: 103/64   Pulse: 102   Weight: 61.4 kg (135 lb 6.4 oz)     UA 1+ protein, trace leukocytes, neg nitrites, neg blood.         Labs:  01/05/20 gc/chlam neg,   05/17/20 glucola 144, Hct 31.7, plt 274, ab screen neg, Treponema ab neg  06/26/20 HIV neg, gc neg/ chlam neg  07/02/20 gbs neg      Sono:    06/18/20 [redacted]w[redacted]d, EFW 39%, AC 56%, AFI 14 cm  07/09/20 BPP 8/8, AFI 8 cm  07/16/20 Normal growth. See AS for weight    A 24 y  [id number] at [redacted]w[redacted]d  by 7 wk CRL  GDM  Suspected herniated disc, back pain  Prior CS/prior VBAC  Prior FGR    PLAN:  A2GDM  - continue NPH to 30 am, 16 bedtime.    - continue BPP weekly  - delivery at 38-39 weeks. She will start IOL [redacted]w[redacted]d on 07/23/20.  Can start the IOL at midnight.   - Needs 2 hour GTT after delivery    Back pain affecting pregnancy  - worsened symptoms seem related to pregnancy related spine changes.    Sxs suspicious for herniated disk  - MRI lumbar without contrast scheduled to occur on 05/07/20-has not gotten  - neurontin 200 mg q8 PRN pain  - can use flexeril 10mg  HS PRN pain.   - 06/04/20 started hydrocodone 5/325 mg, 1 tab qhs, dispense 9 pills. Reassess in 1 wk  - 06/11/20 checked KTRACS-no additional rx's seen, discussed importance of imaging, referral to physical therapy made  - 06/28/20:  Refilled #60      High grade squamous intraepithelial lesion (HGSIL) on cytologic smear of cervix (Reviewed history on 04/23/20)  - LGSIL with HR HPV positive status 03/23/19 (HPV 16, 18 and 45 negative).  Was supposed to have colpo at [facility] in May 2021 but never had this set up.    - ASCUS pap 09/12/2019  - Will require another pap with HRHPV screening July 2022.    ?  History of acute cystitis with hematuria  - E. Faecalis UTI on 02/01/20, s/p treatment with Keflex  - Also associated with kidney stone.   - After antibiotics and passing large stone at home, her symptoms resolved  - Continue ppx Keflex   - UCx TOC  03/28/20 neg  - UA 07/02/20: suspicious for UTI.  Lab requisition given but not completed     ?  History of cesarean section  - G1 was SVD at term  - G2 was 38 week pre-labor CS due to FGR with non-reassuring fetal surveillance  - G3 was successful VBAC  - Counseled,  would like TOLAC if there are no contraindications   ?  History of prior pregnancy with IUGR newborn  - Her second pregnancy/delivery was FGR fetus that required delivery at 38 weeks  - Last Growth 2087g (39%-ile) on 06/18/20.  Will repeat in 2 weeks.    ?  Calculus of kidney affecting pregnancy, antepartum  - passed a large stone at home (02/12/20)  - UA WNL 05/21/20   - Reports many UTI and kidney stones in the past.  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 Also reports that she required a stent in one of her pregnancies because she couldn't empty her bladder.   - Discussed ppx antibiotic q HS to prevent UTI and pyelonephritis during pregnancy.  - No obvious anomalies during previous workups.     ?  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 Clarisa Fling on 01/29/20  - Visit with Dr. Bertram Millard 05/21/20 - on fluvoaxamine (discontinued mirtazapine).   ?  Short cervix affecting pregnancy  ? Patient had CL on 04/09/20 of 2.3-2.9 cm, no funnel  ? No history of PTB  ? Preterm labor precautions discussed.    ?  ?High-risk pregnancy in second trimester  - PNL reviewed and WNL, Rh +, RI  - Will require another pap with HRHPV screening July 2022.  - NT WNL with negative NIPS  - DUS completed 03/28/20 wnl  - Declined flu and COVID.  Extensively counseled on risks of COVID during pregnancy at previous visits.    - Declined Tdap 05/17/20  - GBS 07/02/20 neg  - BCM - postplacental Paragard.- would like to forgo paragaurd and hopefully have hysterectomy at a later date    IOL scheduled 5/30    Patient has not gotten MRI yet, may need to do PP  Physical therapy referral made.    Pre-eclampsia s/sx discussed  Preterm labor instructions discussed        Kary Kos, MD     Total Time Today was 30 minutes in the following activities: Preparing to see the patient, Obtaining and/or reviewing separately obtained history, Performing a medically appropriate examination and/or evaluation, Counseling and educating the patient/family/caregiver, Ordering medications, tests, or procedures and Documenting clinical information in the electronic or other health record

## 2020-07-16 NOTE — Progress Notes
Forgot to bring glucose log, advised to send in for review later today.

## 2020-07-22 ENCOUNTER — Encounter: Admit: 2020-07-22 | Discharge: 2020-07-22 | Payer: MEDICAID

## 2020-07-22 ENCOUNTER — Inpatient Hospital Stay: Admit: 2020-07-22 | Discharge: 2020-07-22 | Payer: MEDICAID

## 2020-07-22 DIAGNOSIS — J45909 Unspecified asthma, uncomplicated: Secondary | ICD-10-CM

## 2020-07-22 DIAGNOSIS — N390 Urinary tract infection, site not specified: Secondary | ICD-10-CM

## 2020-07-22 DIAGNOSIS — Z3490 Encounter for supervision of normal pregnancy, unspecified, unspecified trimester: Secondary | ICD-10-CM

## 2020-07-22 DIAGNOSIS — F32A Depression: Secondary | ICD-10-CM

## 2020-07-22 DIAGNOSIS — F419 Anxiety disorder, unspecified: Secondary | ICD-10-CM

## 2020-07-22 DIAGNOSIS — R569 Unspecified convulsions: Secondary | ICD-10-CM

## 2020-07-22 DIAGNOSIS — F4310 Post-traumatic stress disorder, unspecified: Secondary | ICD-10-CM

## 2020-07-22 DIAGNOSIS — N200 Calculus of kidney: Secondary | ICD-10-CM

## 2020-07-22 LAB — URINALYSIS DIPSTICK POC
UR. BILIRUBIN POC: NEGATIVE
UR. GLUCOSE POC: NEGATIVE mL/min — ABNORMAL LOW
UR. NITRITE POC: NEGATIVE
UR. SPECIFIC GRAVITY POC: 1 U/L (ref 1.005–1.030)

## 2020-07-22 LAB — URINALYSIS DIPSTICK
GLUCOSE,UA: NEGATIVE (ref 0–5)
NITRITE: NEGATIVE
URINE ASCORBIC ACID, UA: NEGATIVE
URINE BILE: NEGATIVE

## 2020-07-22 LAB — URINALYSIS, MICROSCOPIC

## 2020-07-22 LAB — CBC
HEMOGLOBIN: 8.3 g/dL — ABNORMAL LOW (ref 12.0–15.0)
MCH: 22 pg — ABNORMAL LOW (ref 26–34)
MCHC: 31 g/dL — ABNORMAL LOW (ref 32.0–36.0)
MCV: 71 FL — ABNORMAL LOW (ref 80–100)
MPV: 9.2 FL (ref 7–11)
PLATELET COUNT: 169 K/UL (ref 150–400)
RBC COUNT: 3.6 M/UL — ABNORMAL LOW (ref 4.0–5.0)
RDW: 16 % — ABNORMAL HIGH (ref 11–15)
WBC COUNT: 9.9 K/UL (ref 4.5–11.0)

## 2020-07-22 LAB — POC GLUCOSE
POC GLUCOSE: 108 mg/dL — ABNORMAL HIGH (ref 70–100)
POC GLUCOSE: 103 mg/dL — ABNORMAL HIGH (ref 70–100)

## 2020-07-22 LAB — SYPHILIS AB SCREEN: SYPHILIS AB, TOTAL: NEGATIVE % — ABNORMAL LOW (ref 36–45)

## 2020-07-22 MED ORDER — FLUVOXAMINE 100 MG PO TAB
100 mg | Freq: Every evening | ORAL | 0 refills | Status: AC
Start: 2020-07-22 — End: ?
  Administered 2020-07-24: 02:00:00 100 mg via ORAL

## 2020-07-22 MED ORDER — LACTATED RINGERS IV SOLP
INTRAVENOUS | 0 refills | Status: AC
Start: 2020-07-22 — End: ?
  Administered 2020-07-23: 03:00:00 1000.000 mL via INTRAVENOUS

## 2020-07-22 MED ORDER — NALOXONE 0.4 MG/ML IJ SOLN
.08 mg | INTRAVENOUS | 0 refills | Status: AC | PRN
Start: 2020-07-22 — End: ?

## 2020-07-22 MED ORDER — LIDOCAINE-EPINEPHRINE (PF) 1.5 %-1:200,000 IJ SOLN (OR)
0 refills | Status: CP
Start: 2020-07-22 — End: ?

## 2020-07-22 MED ORDER — OXYTOCIN IN 0.9 % SOD CHLORIDE 30 UNIT/500 ML IV SOLN
.5-20 m[IU]/min | INTRAVENOUS | 0 refills | Status: AC
Start: 2020-07-22 — End: ?
  Administered 2020-07-23: 05:00:00 2 m[IU]/min via INTRAVENOUS

## 2020-07-22 MED ORDER — LIDOCAINE (PF) 10 MG/ML (1 %) IJ SOLN
INTRAMUSCULAR | 0 refills | Status: CP
Start: 2020-07-22 — End: ?
  Administered 2020-07-23: 04:00:00 5 mL via INTRAMUSCULAR

## 2020-07-22 MED ORDER — INSULIN REGULAR IN 0.9 % NACL 100 UNIT/100 ML (1 UNIT/ML) IV SOLN
1-32 [IU]/h | INTRAVENOUS | 0 refills | Status: AC
Start: 2020-07-22 — End: ?

## 2020-07-22 MED ORDER — OXYTOCIN IN 0.9 % SOD CHLORIDE 30 UNIT/500 ML IV SOLN
24 [IU] | Freq: Once | INTRAVENOUS | 0 refills | Status: AC
Start: 2020-07-22 — End: ?

## 2020-07-22 MED ORDER — GABAPENTIN 100 MG PO CAP
200 mg | ORAL | 0 refills | Status: AC
Start: 2020-07-22 — End: ?
  Administered 2020-07-23 – 2020-07-24 (×3): 200 mg via ORAL

## 2020-07-22 MED ORDER — ALBUTEROL SULFATE 90 MCG/ACTUATION IN HFAA
2 | RESPIRATORY_TRACT | 0 refills | Status: AC | PRN
Start: 2020-07-22 — End: ?

## 2020-07-22 MED ORDER — FENTANYL/BUPIVACAINE 2 MCG/ML-0.1% PCA EPIDURAL SYRINGE
EPIDURAL | 0 refills | Status: AC
Start: 2020-07-22 — End: ?
  Administered 2020-07-23 (×3): 50.000 mL via EPIDURAL

## 2020-07-22 MED ORDER — MISOPROSTOL 200 MCG PO TAB
800 ug | Freq: Once | RECTAL | 0 refills | Status: AC
Start: 2020-07-22 — End: ?

## 2020-07-22 MED ORDER — CEPHALEXIN 500 MG PO CAP
500 mg | Freq: Every evening | ORAL | 0 refills | Status: AC
Start: 2020-07-22 — End: ?
  Administered 2020-07-23 – 2020-07-24 (×2): 500 mg via ORAL

## 2020-07-22 MED ORDER — SODIUM CITRATE-CITRIC ACID 500-334 MG/5 ML PO SOLN
30 mL | Freq: Once | ORAL | 0 refills | Status: AC | PRN
Start: 2020-07-22 — End: ?

## 2020-07-22 MED ORDER — COPPER 380 SQUARE MM IU IUD
1 | Freq: Once | INTRAUTERINE | 0 refills | Status: DC
Start: 2020-07-22 — End: 2020-07-23

## 2020-07-23 ENCOUNTER — Encounter: Admit: 2020-07-23 | Discharge: 2020-07-23 | Payer: MEDICAID

## 2020-07-23 ENCOUNTER — Inpatient Hospital Stay: Admit: 2020-07-23 | Payer: MEDICAID

## 2020-07-23 DIAGNOSIS — N390 Urinary tract infection, site not specified: Secondary | ICD-10-CM

## 2020-07-23 DIAGNOSIS — F4310 Post-traumatic stress disorder, unspecified: Secondary | ICD-10-CM

## 2020-07-23 DIAGNOSIS — F32A Depression: Secondary | ICD-10-CM

## 2020-07-23 DIAGNOSIS — N200 Calculus of kidney: Secondary | ICD-10-CM

## 2020-07-23 DIAGNOSIS — J45909 Unspecified asthma, uncomplicated: Secondary | ICD-10-CM

## 2020-07-23 DIAGNOSIS — R569 Unspecified convulsions: Secondary | ICD-10-CM

## 2020-07-23 DIAGNOSIS — F419 Anxiety disorder, unspecified: Secondary | ICD-10-CM

## 2020-07-23 MED ADMIN — CALCIUM CARBONATE 200 MG CALCIUM (500 MG) PO CHEW [9385]: 500 mg | ORAL | @ 07:00:00 | Stop: 2020-07-23 | NDC 66553000401

## 2020-07-23 MED ADMIN — HYDROCODONE-ACETAMINOPHEN 5-325 MG PO TAB [34505]: 1 | ORAL | @ 14:00:00 | NDC 00406012323

## 2020-07-23 MED ADMIN — MISOPROSTOL 200 MCG PO TAB [10629]: 800 ug | RECTAL | @ 12:00:00 | Stop: 2020-07-23 | NDC 68084004111

## 2020-07-23 MED ADMIN — ACETAMINOPHEN 325 MG PO TAB [101]: 650 mg | ORAL | @ 19:00:00 | NDC 00904677361

## 2020-07-23 MED ADMIN — DEXTROSE 5%-0.9% SODIUM CHLORIDE IV SOLP [15882]: 1000.000 mL | INTRAVENOUS | @ 09:00:00 | Stop: 2020-07-23 | NDC 00338008904

## 2020-07-23 MED ADMIN — ACETAMINOPHEN 325 MG PO TAB [101]: 650 mg | ORAL | @ 14:00:00 | NDC 00904677361

## 2020-07-23 MED ADMIN — ONDANSETRON HCL (PF) 4 MG/2 ML IJ SOLN [136012]: 4 mg | INTRAVENOUS | @ 13:00:00 | Stop: 2020-07-23 | NDC 36000001225

## 2020-07-23 NOTE — Anesthesia Pre-Procedure Evaluation
Anesthesia Pre-Procedure Evaluation    Name: Emily [name]      MRN: [id number]     DOB: June 08, 1995     Age: 25 y.o.     Sex: female   _________________________________________________________________________     Procedure Info:   Procedure Information    Date: 07/22/20  Procedure: ANESTHESIA PRE-EVAL         Physical Assessment  Vital Signs (last filed in past 24 hours):         Patient History   Allergies   Allergen Reactions   ? Ibuprofen ANAPHYLAXIS   ? Ketorolac Tromethamine ANAPHYLAXIS   ? Nsaids (Non-Steroidal Anti-Inflammatory Drug) ANAPHYLAXIS and RASH   ? Amoxicillin HIVES   ? Cherry HIVES and SHORTNESS OF BREATH   ? Nitrofurantoin Monohyd/M-Cryst RASH   ? Ranitidine HIVES   ? Seasonal Allergies EYE IRRITATION        Current Medications    Medication Directions   acetaminophen (TYLENOL) 500 mg tablet Take 1,000 mg by mouth every 6 hours as needed for Pain. Max of 4,000 mg of acetaminophen in 24 hours.   albuterol sulfate (PROAIR HFA) 90 mcg/actuation HFA aerosol inhaler Inhale two puffs by mouth into the lungs every 6 hours as needed for Wheezing or Shortness of Breath. Shake well before use.   blood sugar diagnostic test strip O99.810 Diag: Check 4 times daily fasting and 2 hours after breakfast, lunch, and dinner   Blood-Glucose Meter kit Use one each as directed before meals and at bedtime. O99.810  Diag: Check 4 times daily fasting and 2 hours after breakfast, lunch, and dinner  Indications: type 2 diabetes mellitus   cephalexin (KEFLEX) 500 mg capsule Take one capsule by mouth at bedtime daily. Indications: infection prophylaxis, medical   cyclobenzaprine (FLEXERIL) 5 mg tablet Take two tablets by mouth at bedtime as needed for Muscle Cramps. Take at night for back pain   fluvoxaMINE (LUVOX) 100 mg tablet Take one tablet by mouth at bedtime daily.   gabapentin (NEURONTIN) 100 mg capsule Take two capsules by mouth every 8 hours. Indications: neuropathic pain   HYDROcodone/acetaminophen (NORCO) 5/325 mg tablet Take one tablet by mouth every 12 hours as needed for Pain.   insulin NPH (HUMULIN N NPH INSULIN KWIKPEN) 100 unit/mL (3 mL) injection PEN 24 units in the morning. 10 units with dinner   insulin pen needles (disposable) (BD UF NANO PEN NEEDLES) 32 gauge x 5/32 pen needle Use one each as directed twice daily before meals. Use with insulin injections.   lancets 30 guage 30 gauge Diag:O99.810 Check 4 times daily fasting and 2 hours after breakfast, lunch, and dinner   metoclopramide HCL (REGLAN) 10 mg tablet Take one tablet by mouth every 6 hours as needed for Nausea or Vomiting.   promethazine (PHENERGAN) 25 mg tablet    pyridoxine (VITAMIN B-6) 25 mg tab Take 100 mg by mouth three times daily.   vitamins, prenatal w/iron & folate 65/1 mg tab Take 2 tablets by mouth daily.         Review of Systems/Medical History      Patient summary reviewed  Nursing notes reviewed  Pertinent labs reviewed    PONV Screening: Female gender and Non-smoker      Airway - negative        Pulmonary           Asthma      Cardiovascular - negative        GI/Hepatic/Renal  GERD, well controlled      Neuro/Psych       Seizures        Psychiatric history          Depression          Anxiety      PTSD      Musculoskeletal - negative        Endocrine/Other - negative      Constitution - negative   Physical Exam    Airway Findings      Mallampati: II      TM distance: >3 FB      Neck ROM: full      Mouth opening: good      Airway patency: adequate    Dental Findings:       Upper dentures and lower dentures    Cardiovascular Findings: Negative      Pulmonary Findings: Negative      Abdominal Findings:         Gravid    Neurological Findings: Negative         Diagnostic Tests  Hematology:   Lab Results   Component Value Date    HGB 12.9 02/01/2020    HCT 38.8 02/01/2020    PLTCT 197 02/01/2020    WBC 18.0 02/01/2020    NEUT 89 02/01/2020    ANC 15.97 02/01/2020    ALC 1.25 02/01/2020    MONA 4 02/01/2020    AMC 0.69 02/01/2020    EOSA 0 02/01/2020    ABC 0.05 02/01/2020    MCV 85.0 02/01/2020    MCH 28.3 02/01/2020    MCHC 33.3 02/01/2020    MPV 10.2 02/01/2020    RDW 15.1 02/01/2020         General Chemistry:   Lab Results   Component Value Date    NA 135 02/01/2020    K 3.8 02/01/2020    CL 100 02/01/2020    CO2 21 02/01/2020    GAP 14 02/01/2020    BUN 11 02/01/2020    CR 0.5 02/01/2020    CR 0.59 02/01/2020    GLU 78 02/01/2020    CA 9.6 02/01/2020    ALBUMIN 4.2 02/01/2020    MG 1.8 02/01/2020    TOTBILI 0.8 02/01/2020      Coagulation: No results found for: PT, PTT, INR      Anesthesia Plan    ASA score: 3   Plan: epidural  NPO status: full stomach      Informed Consent  Anesthetic plan and risks discussed with patient and spouse.  Use of blood products discussed with patient and spouse      Plan discussed with: anesthesiologist and CRNA.  Anesthesia Pre-Procedure Evaluation    Name: Emily [name]      MRN: [id number]     DOB: Jun 19, 1995     Age: 25 y.o.     Sex: female   _________________________________________________________________________     Procedure Info:   Procedure Information    Date: 07/22/20  Procedure: ANESTHESIA PRE-EVAL         Physical Assessment  Vital Signs (last filed in past 24 hours):         Patient History   Allergies   Allergen Reactions   ? Ibuprofen ANAPHYLAXIS   ? Ketorolac Tromethamine ANAPHYLAXIS   ? Nsaids (Non-Steroidal Anti-Inflammatory Drug) ANAPHYLAXIS and RASH   ? Amoxicillin HIVES   ? Cherry HIVES and SHORTNESS OF BREATH   ? Nitrofurantoin Monohyd/M-Cryst  RASH   ? Ranitidine HIVES   ? Seasonal Allergies EYE IRRITATION        Current Medications    Medication Directions   acetaminophen (TYLENOL) 500 mg tablet Take 1,000 mg by mouth every 6 hours as needed for Pain. Max of 4,000 mg of acetaminophen in 24 hours.   albuterol sulfate (PROAIR HFA) 90 mcg/actuation HFA aerosol inhaler Inhale two puffs by mouth into the lungs every 6 hours as needed for Wheezing or Shortness of Breath. Shake well before use.   blood sugar diagnostic test strip O99.810 Diag: Check 4 times daily fasting and 2 hours after breakfast, lunch, and dinner   Blood-Glucose Meter kit Use one each as directed before meals and at bedtime. O99.810  Diag: Check 4 times daily fasting and 2 hours after breakfast, lunch, and dinner  Indications: type 2 diabetes mellitus   cephalexin (KEFLEX) 500 mg capsule Take one capsule by mouth at bedtime daily. Indications: infection prophylaxis, medical   cyclobenzaprine (FLEXERIL) 5 mg tablet Take two tablets by mouth at bedtime as needed for Muscle Cramps. Take at night for back pain   fluvoxaMINE (LUVOX) 100 mg tablet Take one tablet by mouth at bedtime daily.   gabapentin (NEURONTIN) 100 mg capsule Take two capsules by mouth every 8 hours. Indications: neuropathic pain   HYDROcodone/acetaminophen (NORCO) 5/325 mg tablet Take one tablet by mouth every 12 hours as needed for Pain.   insulin NPH (HUMULIN N NPH INSULIN KWIKPEN) 100 unit/mL (3 mL) injection PEN 24 units in the morning. 10 units with dinner   insulin pen needles (disposable) (BD UF NANO PEN NEEDLES) 32 gauge x 5/32 pen needle Use one each as directed twice daily before meals. Use with insulin injections.   lancets 30 guage 30 gauge Diag:O99.810 Check 4 times daily fasting and 2 hours after breakfast, lunch, and dinner   metoclopramide HCL (REGLAN) 10 mg tablet Take one tablet by mouth every 6 hours as needed for Nausea or Vomiting.   promethazine (PHENERGAN) 25 mg tablet    pyridoxine (VITAMIN B-6) 25 mg tab Take 100 mg by mouth three times daily.   vitamins, prenatal w/iron & folate 65/1 mg tab Take 2 tablets by mouth daily.         Review of Systems/Medical History      Patient summary reviewed  Nursing notes reviewed  Pertinent labs reviewed    PONV Screening: Female gender and Non-smoker      Airway - negative        Pulmonary           Asthma      Cardiovascular - negative        GI/Hepatic/Renal           GERD, well controlled      Neuro/Psych       Seizures        Psychiatric history          Depression          Anxiety      PTSD      Musculoskeletal - negative        Endocrine/Other - negative      Constitution - negative   Physical Exam    Airway Findings      Mallampati: II      TM distance: >3 FB      Neck ROM: full      Mouth opening: good      Airway patency: adequate  Dental Findings:       Upper dentures and lower dentures    Cardiovascular Findings: Negative      Pulmonary Findings: Negative      Abdominal Findings:         Gravid    Neurological Findings: Negative         Diagnostic Tests  Hematology:   Lab Results   Component Value Date    HGB 12.9 02/01/2020    HCT 38.8 02/01/2020    PLTCT 197 02/01/2020    WBC 18.0 02/01/2020    NEUT 89 02/01/2020    ANC 15.97 02/01/2020    ALC 1.25 02/01/2020    MONA 4 02/01/2020    AMC 0.69 02/01/2020    EOSA 0 02/01/2020    ABC 0.05 02/01/2020    MCV 85.0 02/01/2020    MCH 28.3 02/01/2020    MCHC 33.3 02/01/2020    MPV 10.2 02/01/2020    RDW 15.1 02/01/2020         General Chemistry:   Lab Results   Component Value Date    NA 135 02/01/2020    K 3.8 02/01/2020    CL 100 02/01/2020    CO2 21 02/01/2020    GAP 14 02/01/2020    BUN 11 02/01/2020    CR 0.5 02/01/2020    CR 0.59 02/01/2020    GLU 78 02/01/2020    CA 9.6 02/01/2020    ALBUMIN 4.2 02/01/2020    MG 1.8 02/01/2020    TOTBILI 0.8 02/01/2020      Coagulation: No results found for: PT, PTT, INR      Anesthesia Plan    ASA score: 3   Plan: epidural  NPO status: full stomach      Informed Consent  Anesthetic plan and risks discussed with patient and spouse.  Use of blood products discussed with patient and spouse      Plan discussed with: anesthesiologist and CRNA.

## 2020-07-23 NOTE — Progress Notes
RT Adult Assessment Note    NAME:Krishana Nori Winegar             MRN: [id number]             DOB:1995-09-24          AGE: 25 y.o.  ADMISSION DATE: 07/22/2020             DAYS ADMITTED: LOS: 0 days    RT Treatment Plan:  Protocol Plan: Medications  Albuterol: MDI PRN         Additional Comments:  Impressions of the patient: no respiratory distress  Intervention(s)/outcome(s): no PRN tx needed, on RA  Patient education that was completed: n/a  Recommendations to the care team: none at this time    Vital Signs:  Pulse: 101  RR:    SpO2: 100 %  O2 Device: None (Room air)  Liter Flow:    O2%:    Breath Sounds: Clear (Implies normal)  Respiratory Effort: Non-Labored

## 2020-07-23 NOTE — Anesthesia Pre-Procedure Evaluation
Anesthesia Pre-Procedure Evaluation    Name: Emily [name]      MRN: [id number]     DOB: 1995/04/08     Age: 25 y.o.     Sex: female   _________________________________________________________________________     Procedure Info:   Procedure Information    Date: 07/22/20  Procedure: ANESTHESIA LABOR EPIDURAL         Physical Assessment  Vital Signs (last filed in past 24 hours):         Patient History   Allergies   Allergen Reactions   ? Ibuprofen ANAPHYLAXIS   ? Ketorolac Tromethamine ANAPHYLAXIS   ? Nsaids (Non-Steroidal Anti-Inflammatory Drug) ANAPHYLAXIS and RASH   ? Amoxicillin HIVES   ? Cherry HIVES and SHORTNESS OF BREATH   ? Nitrofurantoin Monohyd/M-Cryst RASH   ? Ranitidine HIVES   ? Seasonal Allergies EYE IRRITATION        Current Medications    Medication Directions   acetaminophen (TYLENOL) 500 mg tablet Take 1,000 mg by mouth every 6 hours as needed for Pain. Max of 4,000 mg of acetaminophen in 24 hours.   albuterol sulfate (PROAIR HFA) 90 mcg/actuation HFA aerosol inhaler Inhale two puffs by mouth into the lungs every 6 hours as needed for Wheezing or Shortness of Breath. Shake well before use.   blood sugar diagnostic test strip O99.810 Diag: Check 4 times daily fasting and 2 hours after breakfast, lunch, and dinner   Blood-Glucose Meter kit Use one each as directed before meals and at bedtime. O99.810  Diag: Check 4 times daily fasting and 2 hours after breakfast, lunch, and dinner  Indications: type 2 diabetes mellitus   cephalexin (KEFLEX) 500 mg capsule Take one capsule by mouth at bedtime daily. Indications: infection prophylaxis, medical   cyclobenzaprine (FLEXERIL) 5 mg tablet Take two tablets by mouth at bedtime as needed for Muscle Cramps. Take at night for back pain   fluvoxaMINE (LUVOX) 100 mg tablet Take one tablet by mouth at bedtime daily.   gabapentin (NEURONTIN) 100 mg capsule Take two capsules by mouth every 8 hours. Indications: neuropathic pain   HYDROcodone/acetaminophen (NORCO) 5/325 mg tablet Take one tablet by mouth every 12 hours as needed for Pain.   insulin NPH (HUMULIN N NPH INSULIN KWIKPEN) 100 unit/mL (3 mL) injection PEN 24 units in the morning. 10 units with dinner   insulin pen needles (disposable) (BD UF NANO PEN NEEDLES) 32 gauge x 5/32 pen needle Use one each as directed twice daily before meals. Use with insulin injections.   lancets 30 guage 30 gauge Diag:O99.810 Check 4 times daily fasting and 2 hours after breakfast, lunch, and dinner   metoclopramide HCL (REGLAN) 10 mg tablet Take one tablet by mouth every 6 hours as needed for Nausea or Vomiting.   promethazine (PHENERGAN) 25 mg tablet    pyridoxine (VITAMIN B-6) 25 mg tab Take 100 mg by mouth three times daily.   vitamins, prenatal w/iron & folate 65/1 mg tab Take 2 tablets by mouth daily.         Review of Systems/Medical History      Patient summary reviewed  Nursing notes reviewed  Pertinent labs reviewed    PONV Screening: Female gender and Non-smoker      Airway - negative        Pulmonary           Asthma      Cardiovascular - negative        GI/Hepatic/Renal  GERD, well controlled      Neuro/Psych       Seizures        Psychiatric history          Depression          Anxiety      PTSD      Musculoskeletal - negative        Endocrine/Other - negative      Constitution - negative   Physical Exam    Airway Findings      Mallampati: II      TM distance: >3 FB      Neck ROM: full      Mouth opening: good      Airway patency: adequate    Dental Findings:       Upper dentures and lower dentures    Cardiovascular Findings: Negative      Pulmonary Findings: Negative      Abdominal Findings:         Gravid    Neurological Findings: Negative         Diagnostic Tests  Hematology:   Lab Results   Component Value Date    HGB 12.9 02/01/2020    HCT 38.8 02/01/2020    PLTCT 197 02/01/2020    WBC 18.0 02/01/2020    NEUT 89 02/01/2020    ANC 15.97 02/01/2020    ALC 1.25 02/01/2020    MONA 4 02/01/2020    AMC 0.69 02/01/2020    EOSA 0 02/01/2020    ABC 0.05 02/01/2020    MCV 85.0 02/01/2020    MCH 28.3 02/01/2020    MCHC 33.3 02/01/2020    MPV 10.2 02/01/2020    RDW 15.1 02/01/2020         General Chemistry:   Lab Results   Component Value Date    NA 135 02/01/2020    K 3.8 02/01/2020    CL 100 02/01/2020    CO2 21 02/01/2020    GAP 14 02/01/2020    BUN 11 02/01/2020    CR 0.5 02/01/2020    CR 0.59 02/01/2020    GLU 78 02/01/2020    CA 9.6 02/01/2020    ALBUMIN 4.2 02/01/2020    MG 1.8 02/01/2020    TOTBILI 0.8 02/01/2020      Coagulation: No results found for: PT, PTT, INR      Anesthesia Plan    ASA score: 3   Plan: epidural  NPO status: full stomach      Informed Consent  Anesthetic plan and risks discussed with patient and spouse.  Use of blood products discussed with patient and spouse      Plan discussed with: anesthesiologist and CRNA.  Anesthesia Pre-Procedure Evaluation    Name: Emily [name]      MRN: [id number]     DOB: 1995-06-29     Age: 25 y.o.     Sex: female   _________________________________________________________________________     Procedure Info:   Procedure Information    Date: 07/22/20  Procedure: ANESTHESIA LABOR EPIDURAL         Physical Assessment  Vital Signs (last filed in past 24 hours):         Patient History   Allergies   Allergen Reactions   ? Ibuprofen ANAPHYLAXIS   ? Ketorolac Tromethamine ANAPHYLAXIS   ? Nsaids (Non-Steroidal Anti-Inflammatory Drug) ANAPHYLAXIS and RASH   ? Amoxicillin HIVES   ? Cherry HIVES and SHORTNESS OF BREATH   ? Nitrofurantoin  Monohyd/M-Cryst RASH   ? Ranitidine HIVES   ? Seasonal Allergies EYE IRRITATION        Current Medications    Medication Directions   acetaminophen (TYLENOL) 500 mg tablet Take 1,000 mg by mouth every 6 hours as needed for Pain. Max of 4,000 mg of acetaminophen in 24 hours.   albuterol sulfate (PROAIR HFA) 90 mcg/actuation HFA aerosol inhaler Inhale two puffs by mouth into the lungs every 6 hours as needed for Wheezing or Shortness of Breath. Shake well before use.   blood sugar diagnostic test strip O99.810 Diag: Check 4 times daily fasting and 2 hours after breakfast, lunch, and dinner   Blood-Glucose Meter kit Use one each as directed before meals and at bedtime. O99.810  Diag: Check 4 times daily fasting and 2 hours after breakfast, lunch, and dinner  Indications: type 2 diabetes mellitus   cephalexin (KEFLEX) 500 mg capsule Take one capsule by mouth at bedtime daily. Indications: infection prophylaxis, medical   cyclobenzaprine (FLEXERIL) 5 mg tablet Take two tablets by mouth at bedtime as needed for Muscle Cramps. Take at night for back pain   fluvoxaMINE (LUVOX) 100 mg tablet Take one tablet by mouth at bedtime daily.   gabapentin (NEURONTIN) 100 mg capsule Take two capsules by mouth every 8 hours. Indications: neuropathic pain   HYDROcodone/acetaminophen (NORCO) 5/325 mg tablet Take one tablet by mouth every 12 hours as needed for Pain.   insulin NPH (HUMULIN N NPH INSULIN KWIKPEN) 100 unit/mL (3 mL) injection PEN 24 units in the morning. 10 units with dinner   insulin pen needles (disposable) (BD UF NANO PEN NEEDLES) 32 gauge x 5/32 pen needle Use one each as directed twice daily before meals. Use with insulin injections.   lancets 30 guage 30 gauge Diag:O99.810 Check 4 times daily fasting and 2 hours after breakfast, lunch, and dinner   metoclopramide HCL (REGLAN) 10 mg tablet Take one tablet by mouth every 6 hours as needed for Nausea or Vomiting.   promethazine (PHENERGAN) 25 mg tablet    pyridoxine (VITAMIN B-6) 25 mg tab Take 100 mg by mouth three times daily.   vitamins, prenatal w/iron & folate 65/1 mg tab Take 2 tablets by mouth daily.         Review of Systems/Medical History      Patient summary reviewed  Nursing notes reviewed  Pertinent labs reviewed    PONV Screening: Female gender and Non-smoker      Airway - negative        Pulmonary           Asthma      Cardiovascular - negative        GI/Hepatic/Renal GERD, well controlled      Neuro/Psych       Seizures        Psychiatric history          Depression          Anxiety      PTSD      Musculoskeletal - negative        Endocrine/Other - negative      Constitution - negative   Physical Exam    Airway Findings      Mallampati: II      TM distance: >3 FB      Neck ROM: full      Mouth opening: good      Airway patency: adequate    Dental Findings:       Upper  dentures and lower dentures    Cardiovascular Findings: Negative      Pulmonary Findings: Negative      Abdominal Findings:         Gravid    Neurological Findings: Negative         Diagnostic Tests  Hematology:   Lab Results   Component Value Date    HGB 12.9 02/01/2020    HCT 38.8 02/01/2020    PLTCT 197 02/01/2020    WBC 18.0 02/01/2020    NEUT 89 02/01/2020    ANC 15.97 02/01/2020    ALC 1.25 02/01/2020    MONA 4 02/01/2020    AMC 0.69 02/01/2020    EOSA 0 02/01/2020    ABC 0.05 02/01/2020    MCV 85.0 02/01/2020    MCH 28.3 02/01/2020    MCHC 33.3 02/01/2020    MPV 10.2 02/01/2020    RDW 15.1 02/01/2020         General Chemistry:   Lab Results   Component Value Date    NA 135 02/01/2020    K 3.8 02/01/2020    CL 100 02/01/2020    CO2 21 02/01/2020    GAP 14 02/01/2020    BUN 11 02/01/2020    CR 0.5 02/01/2020    CR 0.59 02/01/2020    GLU 78 02/01/2020    CA 9.6 02/01/2020    ALBUMIN 4.2 02/01/2020    MG 1.8 02/01/2020    TOTBILI 0.8 02/01/2020      Coagulation: No results found for: PT, PTT, INR      Anesthesia Plan    ASA score: 3   Plan: epidural  NPO status: full stomach      Informed Consent  Anesthetic plan and risks discussed with patient and spouse.  Use of blood products discussed with patient and spouse      Plan discussed with: anesthesiologist and CRNA.

## 2020-07-23 NOTE — Anesthesia Procedure Notes
Anesthesia Procedure: Epidural Block    EPIDURAL BLOCK    Date/Time: 07/22/2020 11:12 PM    Patient location: OB  Reason for block: labor epidural      Preprocedure checklist performed: 2 patient identifiers, risks & benefits discussed, patient evaluated, timeout performed, consent obtained, patient being monitored, existing labs reviewed, no anticoagulant within risk period and sterile drape    Sterile technique:  - Proper hand washing  - Cap, mask  - Sterile gloves  - Skin prep for antisepsis      Epidural Procedure  Patient position: sitting  Prep: ChloraPrep    Monitoring: BP and continuous pulse ox  Approach: midline  Location: lumbar  Level/Interspace: L3-4  Injection technique: LOR saline  Procedures: landmark technique      Number of attempts: 1    Needle/epidural catheter:      Needle type: Tuohy     Needle gauge: 17 G     Needle length: 3.5 in     Needle insertion depth: 6 cm     Catheter type: wire reinforced and multi orifice     Catheter size: 19 G     Catheter at skin depth: 11 cm    Procedure Outcome  Events: negative test dose, no paresthesia and negative aspiration test    Patient tolerance of procedure: patient tolerated the procedure well with no immediate complications}    Procedure Medications    Local Anesthesia: lidocaine PF 1% (10 mg/mL) - Injection   5 mL - 07/22/2020 11:12:00 PM  Test Dose: lidocaine 1.5%/EPINEPHrine 1:200,000 1.5 %-1:200,000 - SEE ADMIN INSTRUCTIONS   3 mL - 07/22/2020 11:12:00 PM          Performed by: Elijio Miles, CRNA  Authorized by: Carmel Sacramento, MD

## 2020-07-24 ENCOUNTER — Encounter: Admit: 2020-07-24 | Discharge: 2020-07-24 | Payer: MEDICAID

## 2020-07-24 MED ADMIN — DOCUSATE SODIUM 100 MG PO CAP [2566]: 100 mg | ORAL | @ 02:00:00 | NDC 00904699880

## 2020-07-24 MED ADMIN — SODIUM CHLORIDE 0.9 % IV SOLP [27838]: 300 mg | INTRAVENOUS | @ 15:00:00 | Stop: 2020-07-24 | NDC 00338004902

## 2020-07-24 MED ADMIN — DOCUSATE SODIUM 100 MG PO CAP [2566]: 100 mg | ORAL | @ 15:00:00 | Stop: 2020-07-25 | NDC 00904699880

## 2020-07-24 MED ADMIN — IRON SUCROSE 100 MG IRON/5 ML IV SOLN [80854]: 300 mg | INTRAVENOUS | @ 15:00:00 | Stop: 2020-07-24 | NDC 00517234001

## 2020-07-24 MED ADMIN — HYDROCODONE-ACETAMINOPHEN 5-325 MG PO TAB [34505]: 1 | ORAL | @ 02:00:00 | NDC 00406012323

## 2020-07-24 MED ADMIN — HYDROCODONE-ACETAMINOPHEN 5-325 MG PO TAB [34505]: 1 | ORAL | @ 15:00:00 | Stop: 2020-07-25 | NDC 00406012323

## 2020-07-24 MED ADMIN — ACETAMINOPHEN 325 MG PO TAB [101]: 650 mg | ORAL | @ 06:00:00 | Stop: 2020-07-25 | NDC 00904677361

## 2020-08-21 ENCOUNTER — Encounter: Admit: 2020-08-21 | Discharge: 2020-08-21 | Payer: MEDICAID

## 2020-08-21 MED ORDER — FLUVOXAMINE 100 MG PO TAB
100 mg | ORAL_TABLET | Freq: Every evening | ORAL | 1 refills
Start: 2020-08-21 — End: ?

## 2020-08-22 ENCOUNTER — Encounter: Admit: 2020-08-22 | Discharge: 2020-08-22 | Payer: MEDICAID

## 2020-08-29 ENCOUNTER — Encounter: Admit: 2020-08-29 | Discharge: 2020-08-29 | Payer: MEDICAID

## 2020-08-30 ENCOUNTER — Encounter: Admit: 2020-08-30 | Discharge: 2020-08-30 | Payer: MEDICAID

## 2020-09-12 ENCOUNTER — Encounter: Admit: 2020-09-12 | Discharge: 2020-09-12 | Payer: MEDICAID

## 2020-09-19 ENCOUNTER — Encounter: Admit: 2020-09-19 | Discharge: 2020-09-19 | Payer: MEDICAID

## 2020-09-19 ENCOUNTER — Ambulatory Visit: Admit: 2020-09-19 | Discharge: 2020-09-20 | Payer: MEDICAID

## 2020-09-19 DIAGNOSIS — O99340 Other mental disorders complicating pregnancy, unspecified trimester: Secondary | ICD-10-CM

## 2020-09-19 DIAGNOSIS — F339 Major depressive disorder, recurrent, unspecified: Secondary | ICD-10-CM

## 2020-09-19 DIAGNOSIS — F4310 Post-traumatic stress disorder, unspecified: Secondary | ICD-10-CM

## 2020-09-19 NOTE — Progress Notes
Date of Service: 09/19/2020    Subjective:      Analy [name] is a 25 y.o. female  [id number]    Due to COVID-19 pandemic temporary changes to care delivery, this encounter was completed by video through Zoom/MyChart.   Consent to treat by this means was agreed to by Emily [name].     History of Present Illness   Emily [name] is a 25 y.o. female  with CPTSD, depression, and anxiety affecting pregnancy who is seen today for f/u. I last saw her in March and started fluvoxamine.     Since her last visit, baby was born on 5/31. She reports that she has had a hard time during the postpartum period. Baby had a lot of medical issues after delivery, and she has had a hard time bonding with him. Her OBGYN restarted her on cariprazine and alprazolam at about two weeks postpartum, and it was increased about 2-3 weeks ago to her pre-pregnancy dose of cariprazine. Reports that she is currently taking alprazolam 1mg  bid (pre-pregnancy dose was 2mg  tid).  She also had Lunesta leftover from before pregnancy and she reports that she took it one night when she was having trouble sleeping. She reports that she was able to wake up fine with the baby and that she felt safe.  We discussed this at length during her visit today.  Discussed that I am not comfortable using sedative/hypnotics when moms have new babies who are still waking up frequently throughout the night.  Discussed that the risks of harm coming to the baby are elevated when moms are taking these medications to sleep overnight, and that alternate sleep medications would be safer.  The patient reports concerned about the side effects from both doxepin and trazodone, but said that she would be willing to consider trying them since I am not comfortable with Lunesta at this time.    Prior to pregnancy was taking alprazolam 2mg  tid, Vrylar 3mg , and Lunesta 3mg .     Past trials (ineffective) include: sertraline, buspirone, VPA, citalopram, fluoxetine, vortioxetine, amitritpyline, nortriptyline, paroxeine, prazosin, lurasidone, lunesta, aripiprazole, quetiapine      OB: G8P4 now 2 months postpartum. Baby boy Emily [name] was born via SVD. FOB is husband, Emily [name].  Pregnancy has been complicated by short cervix, h/o HGSIL, prior pregnancy with IUGR, asthma, recurrent UTIs, renal caliculus, and h/o hemorrhage postpartum. She is bottle feeding. Emily [name] has had a few medical issues but is doing okay right now.   ?  OB care [facility]    PMFSH Update:  Vapes a pod every two days  [name] EtOH or drugs     Lives in [place], [place] with husband Emily [name], three kids (4, 2, and 11 months)  Highest level of education achieved is 9th grade, working towards diploma online right now     Contraception: n/a currently pregnant    Review of Systems   Constitutional: Positive for appetite change. Negative for chills, fever and unexpected weight change.   Respiratory: Negative for shortness of breath.    Cardiovascular: Negative for chest pain.   Gastrointestinal: Negative for nausea and vomiting.   Psychiatric/Behavioral: Positive for decreased concentration, dysphoric mood and sleep disturbance. Negative for hallucinations, self-injury and suicidal ideas. The patient is nervous/anxious. The patient is not hyperactive.      Objective:         ? acetaminophen (TYLENOL) 325 mg tablet Take two tablets by mouth every 6 hours as needed.   ? albuterol sulfate (PROAIR  HFA) 90 mcg/actuation HFA aerosol inhaler Inhale two puffs by mouth into the lungs every 6 hours as needed for Wheezing or Shortness of Breath. Shake well before use. (Patient taking differently: Inhale 1-2 puffs by mouth into the lungs every 6 hours as needed for Wheezing or Shortness of Breath. Shake well before use.)   ? ALPRAZolam (XANAX) 0.5 mg tablet    ? MONO-LINYAH 0.25-35 mg-mcg tablet    ? VRAYLAR 3 mg capsule        There were [name] vitals filed for this visit.  telehealth   There is [name] height or weight on file to calculate BMI. telehealth    Physical Exam  Vitals reviewed.   Constitutional:       General: She is not in acute distress.     Appearance: She is not ill-appearing.   HENT:      Head: Normocephalic and atraumatic.   Eyes:      Conjunctiva/sclera: Conjunctivae normal.   Pulmonary:      Effort: Pulmonary effort is normal. [name] respiratory distress.   Skin:     Findings: [name] erythema or rash.   Neurological:      Mental Status: She is alert and oriented to person, place, and time.       Mental Status Evaluation:    General/Constitutional: Appears stated age, in personal attire, good hygiene and grooming.   Eye Contact: Fair  Behavior: Appropriate and cooperative  Speech: regular rate and rhythm, normal tone and volume.   Mood: Okay  Affect: dysthymic, mood congruent  Thought Process: Linear, organized, easy to follow and understand.  Thought Content: Denies SI/HI.   Perception: Denies AVH. [name] evidence of paranoia, delusions, or illusions.   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 She was taking cariprazine, Lunesta, and alprazolam prior to conception and stopped at [facility] (about 4.5 weeks). She is having worsening of anxiety and depression but is able to function and care for her three children at this time. Did not tolerate a mirtazapine trial in late February.  I started her on fluvoxamine in March 2022 based on [employer] testing results and patient preference.  She was lost to follow-up over the last 4 months and in the interim has been restarted on cariprazine and alprazolam by OB/GYN.  Her primary concern today is sleep.  Discussed that I would not recommend Lunesta with an 22-week-old infant, given the frequency that she needs to be up with him feeding.  Offered trazodone or doxepin as alternate options, and discussed that we will need to keep the dose low to minimize oversedation and risk of her harming baby.  The patient voiced understanding of these risks and reports that her husband will be available to help her if she is either unable to wake up or too sleepy to safely care for baby.  Reviewed r/b/se in pregnancy and breastfeeding. [name] acute safety concerns.       Problem 1:  Depression affecting pregnancy; Stable  Problem 2:  CPTSD; Improving  Problem 3:  Anxiety affecting pregnancy, r/o OCD vs SAD;  Improving    Plan:  ? Continue cariprazine 3 mg daily  ? Continue alprazolam 1 mg twice daily  ? Reviewed patient's [employer] testing this afternoon.  Both doxepin and trazodone are in her green to call him in the [employer] testing.  I relayed this information to the patient and await her response as to which medication she would prefer  ? Continue individual therapy       Follow up:  Return to clinic in 4 weeks or sooner PRN    The proposed treatment plan was discussed with the patient/guardian who was provided the opportunity to ask questions and make suggestions regarding alternative treatment.     Safety plan discussed and outlined in AVS    Dimple Casey, MD

## 2020-09-20 ENCOUNTER — Encounter: Admit: 2020-09-20 | Discharge: 2020-09-20 | Payer: MEDICAID

## 2020-09-20 MED ORDER — DOXEPIN 10 MG PO CAP
10 mg | ORAL_CAPSULE | Freq: Every evening | ORAL | 0 refills | Status: CN | PRN
Start: 2020-09-20 — End: ?

## 2020-09-20 NOTE — Progress Notes
Per provider, will start patient on doxepin 10mg  at bedtime as needed.

## 2020-09-25 ENCOUNTER — Encounter: Admit: 2020-09-25 | Discharge: 2020-09-25 | Payer: MEDICAID

## 2020-09-25 NOTE — Telephone Encounter
RN LVM for patient to return clinic call at [phone] regarding MyChart message.

## 2020-10-18 ENCOUNTER — Encounter: Admit: 2020-10-18 | Discharge: 2020-10-18 | Payer: MEDICAID

## 2020-10-18 MED ORDER — DOXEPIN 10 MG PO CAP
ORAL_CAPSULE | Freq: Every evening | 0 refills | PRN
Start: 2020-10-18 — End: ?

## 2020-10-18 NOTE — Telephone Encounter
Received a refill request for doxepin   Patient states she no longer taking doxepin since since it was given her bad side effects

## 2021-04-30 ENCOUNTER — Encounter: Admit: 2021-04-30 | Discharge: 2021-04-30 | Payer: MEDICAID

## 2021-08-05 ENCOUNTER — Encounter: Admit: 2021-08-05 | Discharge: 2021-08-05 | Payer: MEDICAID

## 2021-08-20 IMAGING — US ABDLM
1 series · 14 of 16 positions shown · non-contrast
Comparison: No relevant prior studies available.

[facility]  * Location:

01/31/20
EXAM:  US RETROPERITONEAL COMPLETE, RENAL  ([id number])
INDICATION: INDICATION Bieson kidney stone. Left side. Per Dr Randall LT FLANK PAIN; N/V; 13 WKS
PREGNANT
TECHNIQUE: Real-time complete ultrasound of the retroperitoneum with image documentation.

[Series 1: us abdomen limited · 14 of 16 slices shown]
[im 1/16]
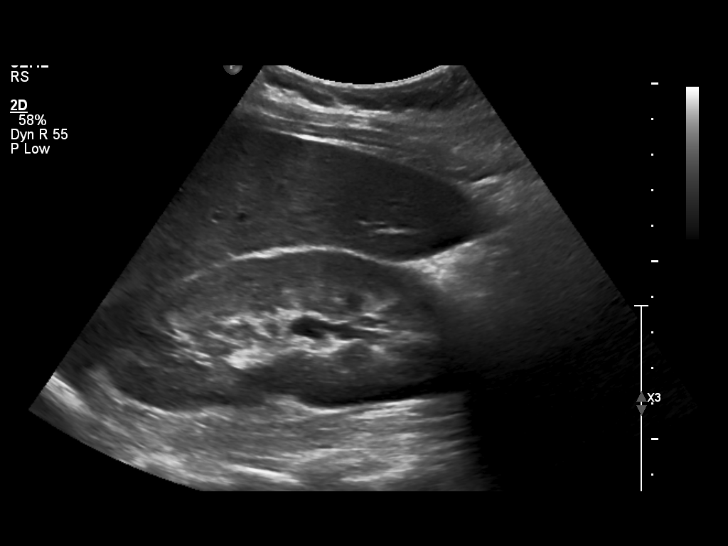
[im 2/16]
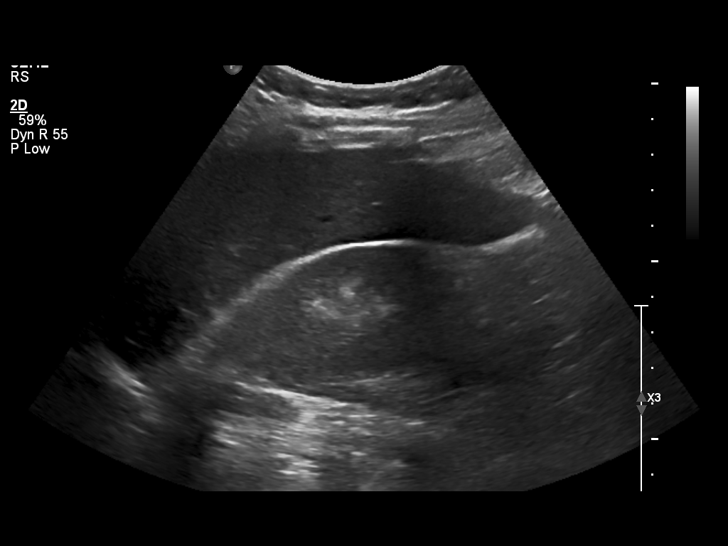
[im 3/16]
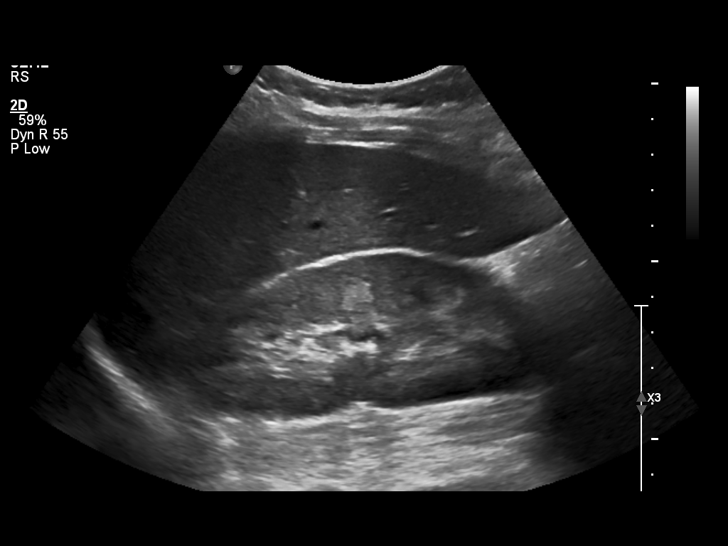
[im 5/16]
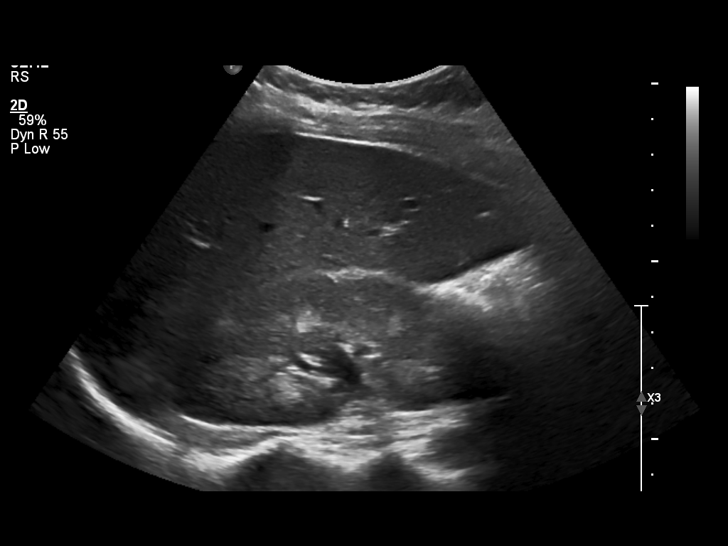
[im 6/16]
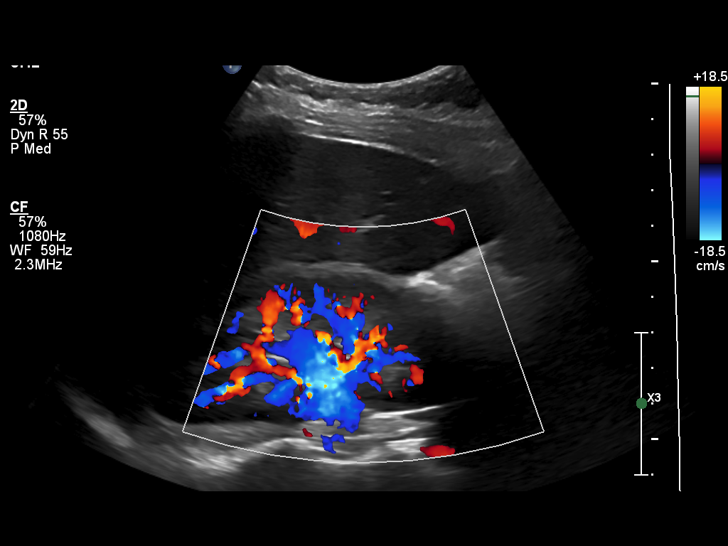
[im 7/16]
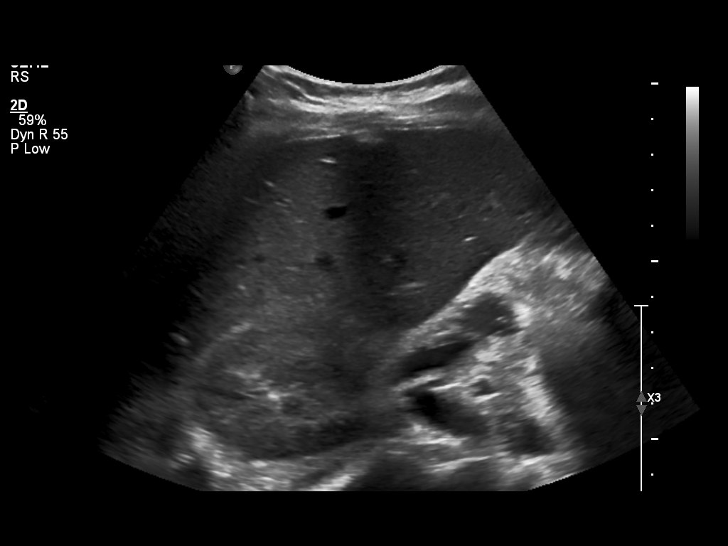
[im 8/16]
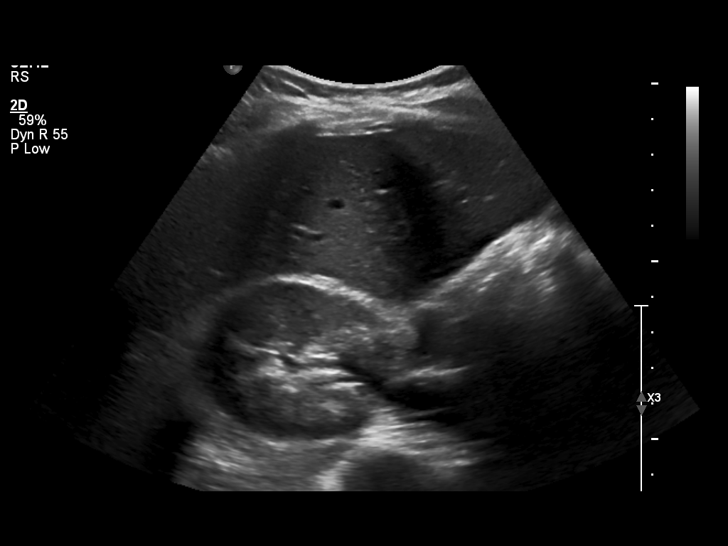
[im 9/16]
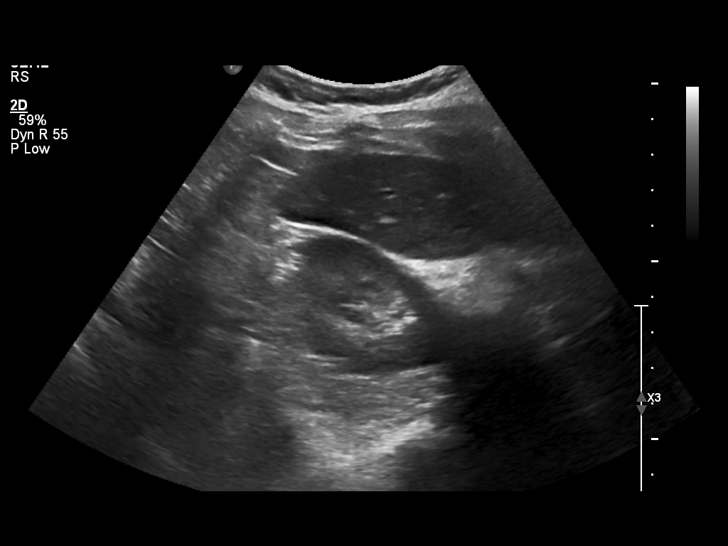
[im 10/16]
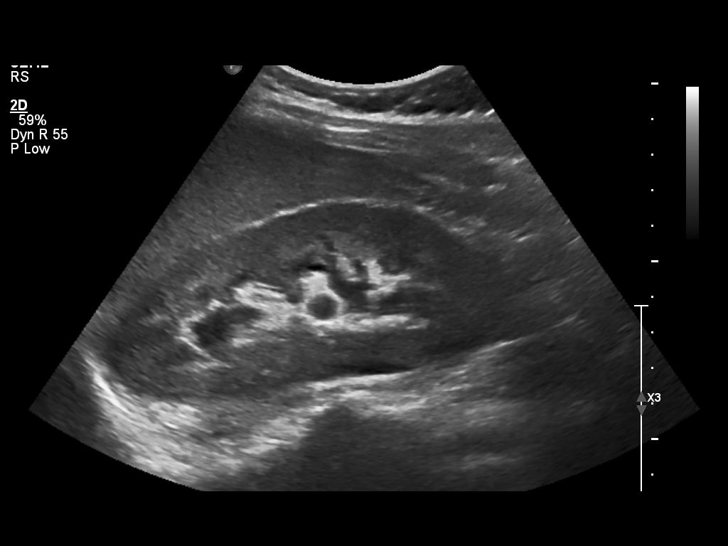
[im 11/16]
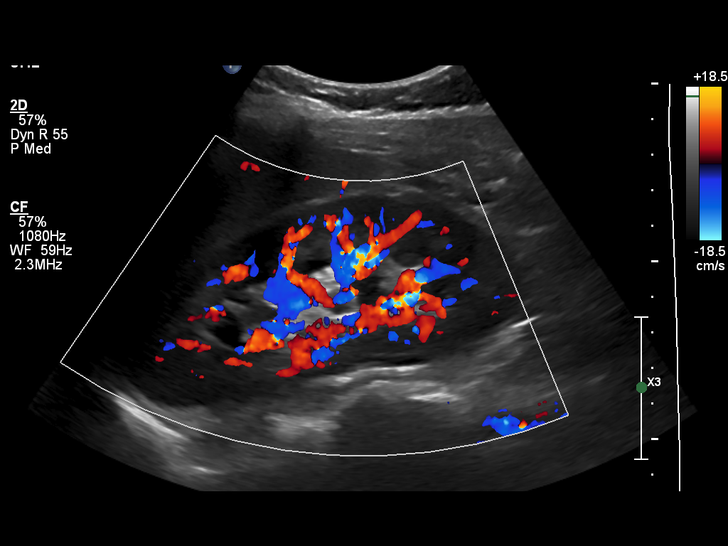
[im 13/16]
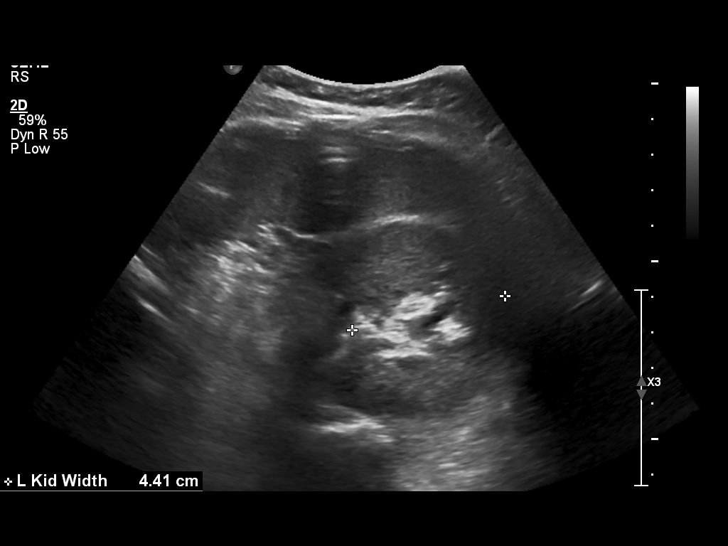
[im 14/16]
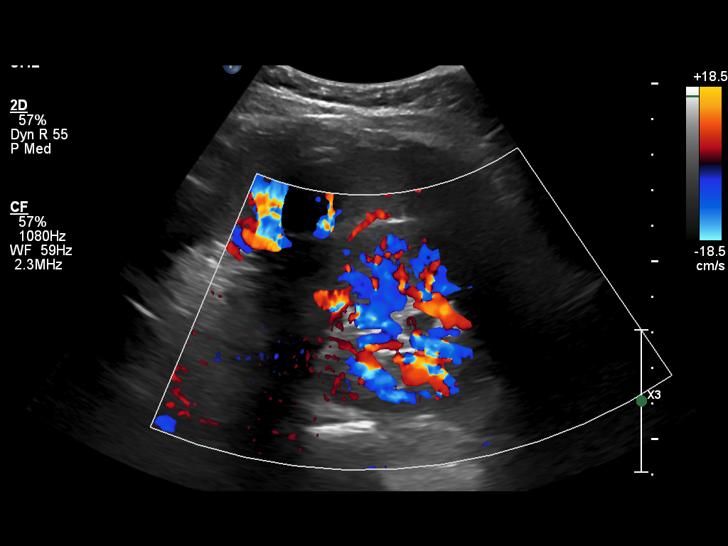
[im 15/16]
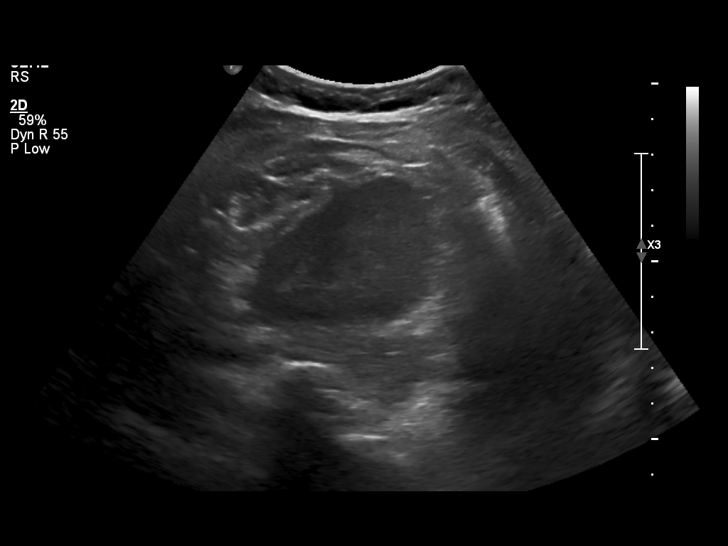
[im 16/16]
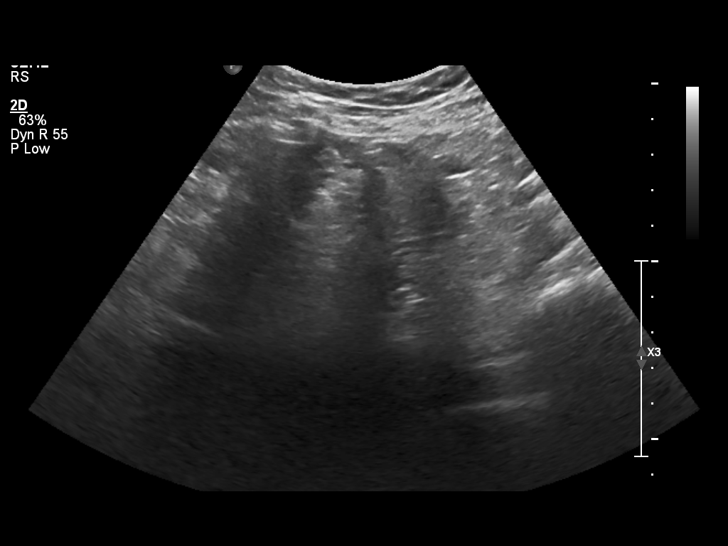

[14 of 16 positions shown; findings below may reference images not displayed]

FINDINGS: RIGHT KIDNEY:  Trace dilatation of the renal collecting system in the RIGHT kidney.

NO obstructing stones or masses on the RIGHT. Kidney measures approximately 11 x 5 cm in greatest
dimension. Cortical thickness 1.4 cm.

LEFT KIDNEY:  Minimal dilatation of the renal collecting system in the LEFT kidney.

NO obstructing stones or masses on the LEFT. Kidney measures approximately 11.8 x 4.7 cm in
greatest dimension. Cortical thickness 1.3 cm.

BLADDER:  Nondistended bladder. Limited/incomplete evaluation.
IMPRESSION: - Minimal dilatation of the renal collecting system in the LEFT kidney.

- Trace dilatation of the renal collecting system in the RIGHT kidney.

Tech Notes:

LT FLANK PAIN; N/V; 13 WKS PREGNANT

## 2021-12-14 ENCOUNTER — Encounter: Admit: 2021-12-14 | Discharge: 2021-12-14 | Payer: MEDICAID

## 2022-03-25 ENCOUNTER — Encounter: Admit: 2022-03-25 | Discharge: 2022-03-25 | Payer: MEDICAID

## 2022-10-30 ENCOUNTER — Encounter: Admit: 2022-10-30 | Discharge: 2022-10-30 | Payer: MEDICAID

## 2023-06-30 ENCOUNTER — Encounter: Admit: 2023-06-30 | Discharge: 2023-06-30 | Payer: MEDICAID
